# Patient Record
Sex: Female | Born: 1965 | Race: Asian | Hispanic: No | Marital: Married | Smoking: Never smoker
Health system: Southern US, Community
[De-identification: ages and names within clinical notes are randomized; demographics above are authoritative.]

## PROBLEM LIST (undated history)

## (undated) DIAGNOSIS — E785 Hyperlipidemia, unspecified: Secondary | ICD-10-CM

## (undated) DIAGNOSIS — I675 Moyamoya disease: Secondary | ICD-10-CM

## (undated) DIAGNOSIS — E079 Disorder of thyroid, unspecified: Secondary | ICD-10-CM

## (undated) DIAGNOSIS — M1990 Unspecified osteoarthritis, unspecified site: Secondary | ICD-10-CM

## (undated) DIAGNOSIS — T7840XA Allergy, unspecified, initial encounter: Secondary | ICD-10-CM

## (undated) HISTORY — PX: BREAST BIOPSY: SHX20

## (undated) HISTORY — DX: Unspecified osteoarthritis, unspecified site: M19.90

## (undated) HISTORY — DX: Disorder of thyroid, unspecified: E07.9

## (undated) HISTORY — DX: Allergy, unspecified, initial encounter: T78.40XA

## (undated) HISTORY — DX: Moyamoya disease: I67.5

## (undated) HISTORY — PX: KNEE ARTHROSCOPY: SUR90

## (undated) HISTORY — DX: Hyperlipidemia, unspecified: E78.5

---

## 2013-07-01 ENCOUNTER — Ambulatory Visit (INDEPENDENT_AMBULATORY_CARE_PROVIDER_SITE_OTHER): Payer: 59 | Admitting: Women's Health

## 2013-07-01 ENCOUNTER — Encounter: Payer: Self-pay | Admitting: Women's Health

## 2013-07-01 ENCOUNTER — Other Ambulatory Visit (HOSPITAL_COMMUNITY)
Admission: RE | Admit: 2013-07-01 | Discharge: 2013-07-01 | Disposition: A | Discharge status: Home / Self Care | Payer: 59 | Source: Ambulatory Visit | Attending: Women's Health | Admitting: Women's Health

## 2013-07-01 VITALS — BP 102/64 | Ht 61.5 in | Wt 102.4 lb

## 2013-07-01 DIAGNOSIS — Z1151 Encounter for screening for human papillomavirus (HPV): Secondary | ICD-10-CM | POA: Insufficient documentation

## 2013-07-01 DIAGNOSIS — Z13220 Encounter for screening for lipoid disorders: Secondary | ICD-10-CM

## 2013-07-01 DIAGNOSIS — Z01419 Encounter for gynecological examination (general) (routine) without abnormal findings: Secondary | ICD-10-CM | POA: Insufficient documentation

## 2013-07-01 DIAGNOSIS — Z833 Family history of diabetes mellitus: Secondary | ICD-10-CM

## 2013-07-01 DIAGNOSIS — M4120 Other idiopathic scoliosis, site unspecified: Secondary | ICD-10-CM

## 2013-07-01 LAB — CBC WITH DIFFERENTIAL/PLATELET
BASOS PCT: 1 % (ref 0–1)
Basophils Absolute: 0 10*3/uL (ref 0.0–0.1)
EOS ABS: 0.1 10*3/uL (ref 0.0–0.7)
EOS PCT: 3 % (ref 0–5)
HCT: 38.6 % (ref 36.0–46.0)
Hemoglobin: 12.9 g/dL (ref 12.0–15.0)
Lymphocytes Relative: 32 % (ref 12–46)
Lymphs Abs: 1.2 10*3/uL (ref 0.7–4.0)
MCH: 31.6 pg (ref 26.0–34.0)
MCHC: 33.4 g/dL (ref 30.0–36.0)
MCV: 94.6 fL (ref 78.0–100.0)
Monocytes Absolute: 0.3 10*3/uL (ref 0.1–1.0)
Monocytes Relative: 7 % (ref 3–12)
NEUTROS PCT: 57 % (ref 43–77)
Neutro Abs: 2.2 10*3/uL (ref 1.7–7.7)
PLATELETS: 247 10*3/uL (ref 150–400)
RBC: 4.08 MIL/uL (ref 3.87–5.11)
RDW: 13.3 % (ref 11.5–15.5)
WBC: 3.9 10*3/uL — ABNORMAL LOW (ref 4.0–10.5)

## 2013-07-01 LAB — LIPID PANEL
Cholesterol: 190 mg/dL (ref 0–200)
HDL: 69 mg/dL (ref >39)
LDL CALC: 109 mg/dL — AB (ref 0–99)
Total CHOL/HDL Ratio: 2.8 Ratio
Triglycerides: 58 mg/dL (ref <150)
VLDL: 12 mg/dL (ref 0–40)

## 2013-07-01 LAB — GLUCOSE, RANDOM: Glucose, Bld: 70 mg/dL (ref 70–99)

## 2013-07-01 NOTE — Progress Notes (Signed)
Katelyn Velazquez 03/23/1965 [id number]    History:    Presents for annual exam.  Regular monthly cycles/vasectomy. Reports abnormal Paps that were normal with recheck. History of nodular breasts, negative right breast biopsy many years ago, normal mammograms after, questionable new nodular area left outer breast.  Past medical history, past surgical history, family history and social history were all reviewed and documented in the EPIC chart. Katelyn Velazquez. 24 year old daughter doing well has had gardasil. Denies family history of breast cancer, diabetes.  ROS:  A  12 point ROS was performed and pertinent positives and negatives are included.  Exam:  Filed Vitals:   07/01/13 0905  BP: 102/64    General appearance:  Normal Thyroid:  Symmetrical, normal in size, without palpable masses or nodularity. Respiratory  Auscultation:  Clear without wheezing or rhonchi Cardiovascular  Auscultation:  Regular rate, without rubs, murmurs or gallops  Edema/varicosities:  Not grossly evident Abdominal  Soft,nontender, without masses, guarding or rebound.  Liver/spleen:  No organomegaly noted  Hernia:  None appreciated  Skin  Inspection:  Grossly normal   Breasts: Examined lying and sitting.     Right: Fibrous, retractions, discharge or axillary adenopathy.     Left: Fibrous, 1 CM mobile nodule outer quadrant, retractions, discharge or axillary adenopathy. Gentitourinary   Inguinal/mons:  Normal without inguinal adenopathy  External genitalia:  Normal  BUS/Urethra/Skene's glands:  Normal  Vagina:  Normal  Cervix:  Normal  Uterus:   normal in size, shape and contour.  Midline and mobile  Adnexa/parametria:     Rt: Without masses or tenderness.   Lt: Without masses or tenderness.  Anus and perineum: Normal  Digital rectal exam: Normal sphincter tone without palpated masses or tenderness  Assessment/Plan:  48 y.o. MAF G1P1 for annual exam.    History of breast nodules, negative right breast  biopsy many years ago Left breast 1 cm mobile nontender nodule outer quadrant 3:00 Regular monthly cycles/vasectomy  Plan: Review mammogram reports from Katelyn Velazquez, SBE's, report changes. Continue regular exercise/running, calcium rich diet, vitamin D 2000 daily encouraged. CBC, glucose, lipid panel, UA, Pap with HR HPV typing.  Katelyn Velazquez called to review/obtain mammogram reports  Left message concerning questionable left breast outer quadrant 1 CM mobile nodule will get scheduled diagnostic mammogram   Katelyn Velazquez, 10:22 AM 07/01/2013

## 2013-07-01 NOTE — Patient Instructions (Signed)

## 2013-07-02 LAB — URINALYSIS W MICROSCOPIC + REFLEX CULTURE
Bilirubin Urine: NEGATIVE
Casts: NONE SEEN
Crystals: NONE SEEN
GLUCOSE, UA: NEGATIVE mg/dL
HGB URINE DIPSTICK: NEGATIVE
Ketones, ur: NEGATIVE mg/dL
LEUKOCYTES UA: NEGATIVE
NITRITE: NEGATIVE
PROTEIN: NEGATIVE mg/dL
SQUAMOUS EPITHELIAL / LPF: NONE SEEN
Specific Gravity, Urine: <1.005 — ABNORMAL LOW (ref 1.005–1.030)
Urobilinogen, UA: 0.2 mg/dL (ref 0.0–1.0)
pH: 7 (ref 5.0–8.0)

## 2013-07-04 ENCOUNTER — Telehealth: Payer: Self-pay | Admitting: *Deleted

## 2013-07-04 LAB — URINE CULTURE

## 2013-07-04 MED ORDER — SULFAMETHOXAZOLE-TMP DS 800-160 MG PO TABS: 1.0000 | ORAL_TABLET | Freq: Two times a day (BID) | ORAL | Status: DC

## 2013-07-04 NOTE — Telephone Encounter (Signed)
Appointment on 07/07/13 @ 1:00 pm left message for pt to call.

## 2013-07-04 NOTE — Telephone Encounter (Signed)
Pt informed with the below note. 

## 2013-07-04 NOTE — Telephone Encounter (Signed)
Message copied by Thamas Jaegers on Mon Jul 04, 2013  8:36 AM ------      Message from: [place], Candiss Norse      Created: Fri Jul 01, 2013  3:14 PM       Needs diagnostic left breast  mammograms at [facility]. Last mammogram 12/13/2012   Has 1 CM mobile nontender nodule outer quadrant left breast at  3:00. No breast cancer hx in family ------

## 2013-07-08 ENCOUNTER — Encounter: Payer: Self-pay | Admitting: Women's Health

## 2013-08-12 ENCOUNTER — Encounter: Payer: Self-pay | Admitting: Women's Health

## 2013-10-26 ENCOUNTER — Ambulatory Visit (INDEPENDENT_AMBULATORY_CARE_PROVIDER_SITE_OTHER): Payer: 59 | Admitting: Women's Health

## 2013-10-26 ENCOUNTER — Encounter: Payer: Self-pay | Admitting: Women's Health

## 2013-10-26 DIAGNOSIS — N898 Other specified noninflammatory disorders of vagina: Secondary | ICD-10-CM

## 2013-10-26 DIAGNOSIS — B373 Candidiasis of vulva and vagina: Secondary | ICD-10-CM

## 2013-10-26 DIAGNOSIS — B3731 Acute candidiasis of vulva and vagina: Secondary | ICD-10-CM

## 2013-10-26 LAB — WET PREP FOR TRICH, YEAST, CLUE
Clue Cells Wet Prep HPF POC: NONE SEEN
TRICH WET PREP: NONE SEEN

## 2013-10-26 MED ORDER — TERCONAZOLE 0.8 % VA CREA: 1.0000 | TOPICAL_CREAM | Freq: Every day | VAGINAL | Status: DC

## 2013-10-26 NOTE — Patient Instructions (Signed)

## 2013-10-26 NOTE — Progress Notes (Signed)
Patient ID: Katelyn Velazquez, female   DOB: [birth year], 48 y.o.   MRN: [id number] Presents with vaginal  itching, irritation and discomfort with intercourse. Denies urinary symptoms abdominal pain or fever. Monthly cycles/vasectomy.  Exam: Appears well. External genitalia erythematous at introitus, speculum exam moderate amount of a white curdy discharge noted, wet prep positive for yeast. Bimanual no CMT or adnexal fullness or tenderness.  Yeast vaginitis  Plan:  Terazol 3 one applicator at bedtime x3 prescription, proper use given and reviewed. Instructed to call if no relief of symptoms.

## 2013-12-26 ENCOUNTER — Encounter: Payer: Self-pay | Admitting: Women's Health

## 2014-01-23 ENCOUNTER — Encounter: Payer: Self-pay | Admitting: Women's Health

## 2014-07-21 ENCOUNTER — Encounter: Payer: Self-pay | Admitting: Women's Health

## 2014-07-28 ENCOUNTER — Ambulatory Visit (INDEPENDENT_AMBULATORY_CARE_PROVIDER_SITE_OTHER): Payer: 59 | Admitting: Women's Health

## 2014-07-28 ENCOUNTER — Encounter: Payer: Self-pay | Admitting: Women's Health

## 2014-07-28 VITALS — BP 101/62 | Ht 61.5 in | Wt 101.6 lb

## 2014-07-28 DIAGNOSIS — Z01419 Encounter for gynecological examination (general) (routine) without abnormal findings: Secondary | ICD-10-CM | POA: Diagnosis not present

## 2014-07-28 DIAGNOSIS — Z13220 Encounter for screening for lipoid disorders: Secondary | ICD-10-CM | POA: Diagnosis not present

## 2014-07-28 LAB — CBC WITH DIFFERENTIAL/PLATELET
Basophils Absolute: 0 10*3/uL (ref 0.0–0.1)
Basophils Relative: 1 % (ref 0–1)
EOS PCT: 2 % (ref 0–5)
Eosinophils Absolute: 0.1 10*3/uL (ref 0.0–0.7)
HCT: 38.7 % (ref 36.0–46.0)
Hemoglobin: 12.5 g/dL (ref 12.0–15.0)
LYMPHS ABS: 1 10*3/uL (ref 0.7–4.0)
LYMPHS PCT: 23 % (ref 12–46)
MCH: 31.3 pg (ref 26.0–34.0)
MCHC: 32.3 g/dL (ref 30.0–36.0)
MCV: 96.8 fL (ref 78.0–100.0)
MPV: 9.6 fL (ref 8.6–12.4)
Monocytes Absolute: 0.4 10*3/uL (ref 0.1–1.0)
Monocytes Relative: 9 % (ref 3–12)
NEUTROS ABS: 2.8 10*3/uL (ref 1.7–7.7)
Neutrophils Relative %: 65 % (ref 43–77)
Platelets: 262 10*3/uL (ref 150–400)
RBC: 4 MIL/uL (ref 3.87–5.11)
RDW: 13.4 % (ref 11.5–15.5)
WBC: 4.3 10*3/uL (ref 4.0–10.5)

## 2014-07-28 LAB — COMPREHENSIVE METABOLIC PANEL
ALBUMIN: 4.3 g/dL (ref 3.5–5.2)
ALK PHOS: 57 U/L (ref 39–117)
ALT: 31 U/L (ref 0–35)
AST: 32 U/L (ref 0–37)
BILIRUBIN TOTAL: 0.5 mg/dL (ref 0.2–1.2)
BUN: 11 mg/dL (ref 6–23)
CALCIUM: 8.9 mg/dL (ref 8.4–10.5)
CO2: 23 meq/L (ref 19–32)
Chloride: 104 mEq/L (ref 96–112)
Creat: 0.85 mg/dL (ref 0.50–1.10)
Glucose, Bld: 94 mg/dL (ref 70–99)
Potassium: 4.7 mEq/L (ref 3.5–5.3)
SODIUM: 136 meq/L (ref 135–145)
Total Protein: 7 g/dL (ref 6.0–8.3)

## 2014-07-28 LAB — LIPID PANEL
Cholesterol: 191 mg/dL (ref 0–200)
HDL: 74 mg/dL (ref >=46)
LDL CALC: 104 mg/dL — AB (ref 0–99)
TRIGLYCERIDES: 63 mg/dL (ref <150)
Total CHOL/HDL Ratio: 2.6 Ratio
VLDL: 13 mg/dL (ref 0–40)

## 2014-07-28 NOTE — Progress Notes (Signed)
Katelyn Velazquez 16-Mar-1965 [id number]    History:    Presents for annual exam.  Monthly cycles/vasectomy. Left breast nodule benign cyst noted on ultrasound 07/2013 annual mammogram due. History of a breast nodule, biopsy negative many years ago. Normal Pap history.  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 Mother Parkinson, father prostate cancer.  ROS:  A ROS was performed and pertinent positives and negatives are included.  Exam:  Filed Vitals:   07/28/14 0806  BP: 101/62    General appearance:  Normal Thyroid:  Symmetrical, normal in size, without palpable masses or nodularity. Respiratory  Auscultation:  Clear without wheezing or rhonchi Cardiovascular  Auscultation:  Regular rate, without rubs, murmurs or gallops  Edema/varicosities:  Not grossly evident Abdominal  Soft,nontender, without masses, guarding or rebound.  Liver/spleen:  No organomegaly noted  Hernia:  None appreciated  Skin  Inspection:  Grossly normal   Breasts: Examined lying and sitting.     Right: Without masses, retractions, discharge or axillary adenopathy.     Left: Without masses, retractions, discharge or axillary adenopathy. Gentitourinary   Inguinal/mons:  Normal without inguinal adenopathy  External genitalia:  Normal  BUS/Urethra/Skene's glands:  Normal  Vagina:  Normal  Cervix:  Normal  Uterus:  normal in size, shape and contour.  Midline and mobile  Adnexa/parametria:     Rt: Without masses or tenderness.   Lt: Without masses or tenderness.  Anus and perineum: Normal  Digital rectal exam: Normal sphincter tone without palpated masses or tenderness  Assessment/Plan:  49 y.o. MAF G1P1 for annual exam with no complaints.  Monthly cycle/vasectomy Left breast cyst  Plan: Screening colonoscopy reviewed and encouraged, [facility] information  given. SBE's, schedule annual 3-D mammogram history of dense breasts. Continue healthy lifestyle of diet and exercise, vitamin D 1000 daily encouraged. CBC, C met, lipid panel, UA, Pap normal with negative HR HPV 2015 screening guidelines reviewed.    [place], 8:36 AM 07/28/2014

## 2014-07-28 NOTE — Patient Instructions (Signed)

## 2014-09-19 ENCOUNTER — Telehealth: Payer: Self-pay | Admitting: *Deleted

## 2014-09-19 NOTE — Telephone Encounter (Signed)
Pt informed with normal labs on 07/28/14 office visit.

## 2014-10-12 ENCOUNTER — Encounter: Payer: Self-pay | Admitting: Women's Health

## 2014-10-12 ENCOUNTER — Ambulatory Visit (INDEPENDENT_AMBULATORY_CARE_PROVIDER_SITE_OTHER): Payer: 59 | Admitting: Women's Health

## 2014-10-12 DIAGNOSIS — B3731 Acute candidiasis of vulva and vagina: Secondary | ICD-10-CM

## 2014-10-12 DIAGNOSIS — N898 Other specified noninflammatory disorders of vagina: Secondary | ICD-10-CM

## 2014-10-12 DIAGNOSIS — B373 Candidiasis of vulva and vagina: Secondary | ICD-10-CM | POA: Diagnosis not present

## 2014-10-12 LAB — WET PREP FOR TRICH, YEAST, CLUE
CLUE CELLS WET PREP: NONE SEEN
TRICH WET PREP: NONE SEEN
YEAST WET PREP: NONE SEEN

## 2014-10-12 MED ORDER — TERCONAZOLE 0.8 % VA CREA: 1.0000 | TOPICAL_CREAM | Freq: Every day | VAGINAL | Status: DC

## 2014-10-12 NOTE — Progress Notes (Signed)
Patient ID: Katelyn Velazquez, female   DOB: [birth year], 49 y.o.   MRN: [id number] Presents with complaint of increased discharge, vaginal irritation with itching for past 2 weeks. Discomfort with intercourse.  Denies odor, urinary symptoms, abdominal pain or fever. Monthly cycles/vasectomy.  Exam: Appears well. External genitalia erythematous at introitus, speculum exam vaginal walls erythematous no discharge noted wet prep negative. Bimanual no CMT or tenderness, tenderness mostly in vaginal area.  Vaginal irritation probable yeast  Plan: Reviewed could be yeast and some vaginal dryness associated with aging. Will try Diflucan Terazol 3 one applicator at bedtime and apply external. Call if no relief, vaginal lubricants with intercourse.

## 2014-10-31 ENCOUNTER — Telehealth: Payer: Self-pay | Admitting: *Deleted

## 2014-10-31 NOTE — Telephone Encounter (Signed)
Pt called c/o vaginal dryness requesting Rx for Diflucan states not itching mainly irritation. Pt said intercourse is unbearable due to vaginal dryness, I recommend pt try OTC Replens to help with this. Pt will follow up if needed.

## 2015-01-10 ENCOUNTER — Encounter: Payer: Self-pay | Admitting: Gastroenterology

## 2015-01-29 ENCOUNTER — Ambulatory Visit (AMBULATORY_SURGERY_CENTER): Payer: Self-pay | Admitting: *Deleted

## 2015-01-29 VITALS — Ht 61.75 in | Wt 97.4 lb

## 2015-01-29 DIAGNOSIS — Z1211 Encounter for screening for malignant neoplasm of colon: Secondary | ICD-10-CM

## 2015-01-29 MED ORDER — NA SULFATE-K SULFATE-MG SULF 17.5-3.13-1.6 GM/177ML PO SOLN: ORAL | Status: DC

## 2015-01-29 NOTE — Progress Notes (Signed)
Patient denies any allergies to eggs or soy. Patient denies any problems with anesthesia/sedation. Patient denies any oxygen use at home and does not take any diet/weight loss medications. Patient declined EMMI information today.  

## 2015-01-30 ENCOUNTER — Encounter: Payer: Self-pay | Admitting: Gastroenterology

## 2015-01-31 ENCOUNTER — Encounter: Payer: Self-pay | Admitting: Women's Health

## 2015-02-12 ENCOUNTER — Encounter: Payer: Self-pay | Admitting: Gastroenterology

## 2015-02-12 ENCOUNTER — Ambulatory Visit (AMBULATORY_SURGERY_CENTER): Payer: 59 | Admitting: Gastroenterology

## 2015-02-12 VITALS — BP 109/73 | HR 59 | Temp 96.7°F | Resp 16 | Ht 61.75 in | Wt 97.0 lb

## 2015-02-12 DIAGNOSIS — Z1211 Encounter for screening for malignant neoplasm of colon: Secondary | ICD-10-CM

## 2015-02-12 DIAGNOSIS — K621 Rectal polyp: Secondary | ICD-10-CM

## 2015-02-12 DIAGNOSIS — D128 Benign neoplasm of rectum: Secondary | ICD-10-CM

## 2015-02-12 MED ORDER — SODIUM CHLORIDE 0.9 % IV SOLN: 500.0000 mL | INTRAVENOUS | Status: DC

## 2015-02-12 NOTE — Op Note (Signed)
[facility]  [address] [place], [id number]   COLONOSCOPY PROCEDURE REPORT  PATIENT: Katelyn Velazquez, Katelyn Velazquez  MR#: [id number] BIRTHDATE: 06/21/1965 , 49  yrs. old GENDER: female ENDOSCOPIST: Milus Banister, MD REFERRED [id number] Spear, M.D. PROCEDURE DATE:  02/12/2015 PROCEDURE:   Colonoscopy, screening and Colonoscopy with snare polypectomy First Screening Colonoscopy - Avg.  risk and is 50 yrs.  old or older - No.  Prior Negative Screening - Now for repeat screening. N/A  History of Adenoma - Now for follow-up colonoscopy & has been > or = to 3 yrs.  N/A  Polyps removed today? Yes ASA CLASS:   Class II INDICATIONS:Screening for colonic neoplasia and Colorectal Neoplasm Risk Assessment for this procedure is average risk. MEDICATIONS: Monitored anesthesia care and Propofol 180 mg IV  DESCRIPTION OF PROCEDURE:   After the risks benefits and alternatives of the procedure were thoroughly explained, informed consent was obtained.  The digital rectal exam revealed no abnormalities of the rectum.   The LB [id number]  endoscope was introduced through the anus and advanced to the cecum, which was identified by both the appendix and ileocecal valve. No adverse events experienced.   The quality of the prep was excellent.  The instrument was then slowly withdrawn as the colon was fully examined. Estimated blood loss is zero unless otherwise noted in this procedure report.   COLON FINDINGS: A sessile polyp measuring 3 mm in size was found in the rectum.  A polypectomy was performed with a cold snare.  The resection was complete, the polyp tissue was completely retrieved and sent to histology.   The examination was otherwise normal. Retroflexed views revealed no abnormalities. The time to cecum = 4.4 Withdrawal time = 13.4   The scope was withdrawn and the procedure completed. COMPLICATIONS: There were no immediate complications.  ENDOSCOPIC IMPRESSION: 1.   Sessile  polyp was found in the rectum; polypectomy was performed with a cold snare 2.   The examination was otherwise normal  RECOMMENDATIONS: If the polyp(s) removed today are proven to be adenomatous (pre-cancerous) polyps, you will need a repeat colonoscopy in 5 years.  Otherwise you should continue to follow colorectal cancer screening guidelines for "routine risk" patients with colonoscopy in 10 years.  You will receive a letter within 1-2 weeks with the results of your biopsy as well as final recommendations.  Please call my office if you have not received a letter after 3 weeks.  eSigned:  Milus Banister, MD 02/12/2015 9:30 AM

## 2015-02-12 NOTE — Progress Notes (Signed)
Called to room to assist during endoscopic procedure.  Patient ID and intended procedure confirmed with present staff. Received instructions for my participation in the procedure from the performing physician.  

## 2015-02-12 NOTE — Progress Notes (Signed)
Pt reported she drank 3 sips of water of 07:40 on her way to the [place] this am.  April Mirtz, RN reported this info to Osvaldo Angst, CRNA and Dr. Owens Loffler, MD.  They both agreed ok to proceed with colonoscopy without extra wait. Maw    April Mirtz, RN advised pt there will be not extra wait for her exam. maw

## 2015-02-12 NOTE — Patient Instructions (Signed)
YOU HAD AN ENDOSCOPIC PROCEDURE TODAY AT THE [facility]:   Refer to the procedure report that was given to you for any specific questions about what was found during the examination.  If the procedure report does not answer your questions, please call your gastroenterologist to clarify.  If you requested that your care partner not be given the details of your procedure findings, then the procedure report has been included in a sealed envelope for you to review at your convenience later.  YOU SHOULD EXPECT: Some feelings of bloating in the abdomen. Passage of more gas than usual.  Walking can help get rid of the air that was put into your GI tract during the procedure and reduce the bloating. If you had a lower endoscopy (such as a colonoscopy or flexible sigmoidoscopy) you may notice spotting of blood in your stool or on the toilet paper. If you underwent a bowel prep for your procedure, you may not have a normal bowel movement for a few days.  Please Note:  You might notice some irritation and congestion in your nose or some drainage.  This is from the oxygen used during your procedure.  There is no need for concern and it should clear up in a day or so.  SYMPTOMS TO REPORT IMMEDIATELY:   Following lower endoscopy (colonoscopy or flexible sigmoidoscopy):  Excessive amounts of blood in the stool  Significant tenderness or worsening of abdominal pains  Swelling of the abdomen that is new, acute  Fever of 100F or higher   For urgent or emergent issues, a gastroenterologist can be reached at any hour by calling [phone].   DIET: Your first meal following the procedure should be a small meal and then it is ok to progress to your normal diet. Heavy or fried foods are harder to digest and may make you feel nauseous or bloated.  Likewise, meals heavy in dairy and vegetables can increase bloating.  Drink plenty of fluids but you should avoid alcoholic beverages for 24  hours.  ACTIVITY:  You should plan to take it easy for the rest of today and you should NOT DRIVE or use heavy machinery until tomorrow (because of the sedation medicines used during the test).    FOLLOW UP: Our staff will call the number listed on your records the next business day following your procedure to check on you and address any questions or concerns that you may have regarding the information given to you following your procedure. If we do not reach you, we will leave a message.  However, if you are feeling well and you are not experiencing any problems, there is no need to return our call.  We will assume that you have returned to your regular daily activities without incident.  If any biopsies were taken you will be contacted by phone or by letter within the next 1-3 weeks.  Please call us at [phone] if you have not heard about the biopsies in 3 weeks.    SIGNATURES/CONFIDENTIALITY: You and/or your care partner have signed paperwork which will be entered into your electronic medical record.  These signatures attest to the fact that that the information above on your After Visit Summary has been reviewed and is understood.  Full responsibility of the confidentiality of this discharge information lies with you and/or your care-partner. 

## 2015-02-13 ENCOUNTER — Telehealth: Payer: Self-pay | Admitting: Emergency Medicine

## 2015-02-13 NOTE — Telephone Encounter (Signed)
   Follow up Call-  Call back number 02/12/2015  Post procedure Call Back phone  # [phone] hm  Permission to leave phone message Yes     Patient questions:  Do you have a fever, pain , or abdominal swelling? No. Pain Score  0 *  Have you tolerated food without any problems? Yes.    Have you been able to return to your normal activities? Yes.    Do you have any questions about your discharge instructions: Diet   No. Medications  No. Follow up visit  No.  Do you have questions or concerns about your Care? No.  Actions: * If pain score is 4 or above: No action needed, pain <4.

## 2015-02-16 ENCOUNTER — Encounter: Payer: Self-pay | Admitting: Gastroenterology

## 2015-09-04 ENCOUNTER — Encounter: Payer: 59 | Admitting: Women's Health

## 2015-09-27 ENCOUNTER — Ambulatory Visit (INDEPENDENT_AMBULATORY_CARE_PROVIDER_SITE_OTHER): Payer: Managed Care, Other (non HMO) | Admitting: Women's Health

## 2015-09-27 ENCOUNTER — Encounter: Payer: Self-pay | Admitting: Women's Health

## 2015-09-27 VITALS — BP 120/78 | Ht 61.0 in | Wt 101.0 lb

## 2015-09-27 DIAGNOSIS — Z1329 Encounter for screening for other suspected endocrine disorder: Secondary | ICD-10-CM

## 2015-09-27 DIAGNOSIS — Z01419 Encounter for gynecological examination (general) (routine) without abnormal findings: Secondary | ICD-10-CM

## 2015-09-27 NOTE — Progress Notes (Signed)
Katelyn Velazquez February 15, 1966 [id number]    History:    Presents for annual exam.  Monthly cycles/vasectomy. Denies menopausal symptoms. Normal Pap and mammogram history. History of a benign breast nodule. 01/2015 benign colon polyp repeat in 10 years.  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 Mother died from Parkinson's, father prostate cancer.  ROS:  A ROS was performed and pertinent positives and negatives are included.  Exam:  Vitals:   09/27/15 1526  BP: 120/78  Weight: 101 lb (45.8 kg)  Height: _0  (1.549 m)   Body mass index is 19.08 kg/m.   General appearance:  Scoliosis Thyroid:  Symmetrical, normal in size, without palpable masses or nodularity. Respiratory  Auscultation:  Clear without wheezing or rhonchi Cardiovascular  Auscultation:  Regular rate, without rubs, murmurs or gallops  Edema/varicosities:  Not grossly evident Abdominal  Soft,nontender, without masses, guarding or rebound.  Liver/spleen:  No organomegaly noted  Hernia:  None appreciated  Skin  Inspection:  Grossly normal   Breasts: Examined lying and sitting.     Right: Without masses, retractions, discharge or axillary adenopathy.     Left: Without masses, retractions, discharge or axillary adenopathy. Gentitourinary   Inguinal/mons:  Normal without inguinal adenopathy  External genitalia:  Normal  BUS/Urethra/Skene's glands:  Normal  Vagina:  Normal  Cervix:  Normal  Uterus:  normal in size, shape and contour.  Midline and mobile  Adnexa/parametria:     Rt: Without masses or tenderness.   Lt: Without masses or tenderness.  Anus and perineum: Normal  Digital rectal exam: Normal sphincter tone without palpated masses or tenderness  Assessment/Plan:  50 y.o. MAF G1 P1  for annual exam with complaint of left knee pain.  Monthly cycle/vasectomy Left knee  pain-follow-up with orthopedist  Plan: SBE's, exercise, calcium rich diet, vitamin D 1000 daily encouraged. Continue healthy lifestyle, follow-up with orthopedist, knee injury from running. CBC, CMP, TSH, vitamin D, UA, Pap normal with negative HR HPV 2015, new screening guidelines reviewed. Normal lipid panel 2016.  Huel Cote [facility], 4:01 PM 09/27/2015

## 2015-09-27 NOTE — Patient Instructions (Signed)
Health Maintenance, Female Adopting a healthy lifestyle and getting preventive care can go a long way to promote health and wellness. Talk with your health care provider about what schedule of regular examinations is right for you. This is a good chance for you to check in with your provider about disease prevention and staying healthy. In between checkups, there are plenty of things you can do on your own. Experts have done a lot of research about which lifestyle changes and preventive measures are most likely to keep you healthy. Ask your health care provider for more information. WEIGHT AND DIET  Eat a healthy diet  Be sure to include plenty of vegetables, fruits, low-fat dairy products, and lean protein.  Do not eat a lot of foods high in solid fats, added sugars, or salt.  Get regular exercise. This is one of the most important things you can do for your health.  Most adults should exercise for at least 150 minutes each week. The exercise should increase your heart rate and make you sweat (moderate-intensity exercise).  Most adults should also do strengthening exercises at least twice a week. This is in addition to the moderate-intensity exercise.  Maintain a healthy weight  Body mass index (BMI) is a measurement that can be used to identify possible weight problems. It estimates body fat based on height and weight. Your health care provider can help determine your BMI and help you achieve or maintain a healthy weight.  For females 20 years of age and older:   A BMI below 18.5 is considered underweight.  A BMI of 18.5 to 24.9 is normal.  A BMI of 25 to 29.9 is considered overweight.  A BMI of 30 and above is considered obese.  Watch levels of cholesterol and blood lipids  You should start having your blood tested for lipids and cholesterol at 52 years of age, then have this test every 5 years.  You may need to have your cholesterol levels checked more often if:  Your lipid  or cholesterol levels are high.  You are older than 52 years of age.  You are at high risk for heart disease.  CANCER SCREENING   Lung Cancer  Lung cancer screening is recommended for adults 55-80 years old who are at high risk for lung cancer because of a history of smoking.  A yearly low-dose CT scan of the lungs is recommended for people who:  Currently smoke.  Have quit within the past 15 years.  Have at least a 30-pack-year history of smoking. A pack year is smoking an average of one pack of cigarettes a day for 1 year.  Yearly screening should continue until it has been 15 years since you quit.  Yearly screening should stop if you develop a health problem that would prevent you from having lung cancer treatment.  Breast Cancer  Practice breast self-awareness. This means understanding how your breasts normally appear and feel.  It also means doing regular breast self-exams. Let your health care provider know about any changes, no matter how small.  If you are in your 20s or 30s, you should have a clinical breast exam (CBE) by a health care provider every 1-3 years as part of a regular health exam.  If you are 40 or older, have a CBE every year. Also consider having a breast X-ray (mammogram) every year.  If you have a family history of breast cancer, talk to your health care provider about genetic screening.  If you   are at high risk for breast cancer, talk to your health care provider about having an MRI and a mammogram every year.  Breast cancer gene (BRCA) assessment is recommended for women who have family members with BRCA-related cancers. BRCA-related cancers include:  Breast.  Ovarian.  Tubal.  Peritoneal cancers.  Results of the assessment will determine the need for genetic counseling and BRCA1 and BRCA2 testing. Cervical Cancer Your health care provider may recommend that you be screened regularly for cancer of the pelvic organs (ovaries, uterus, and  vagina). This screening involves a pelvic examination, including checking for microscopic changes to the surface of your cervix (Pap test). You may be encouraged to have this screening done every 3 years, beginning at age 21.  For women ages 30-65, health care providers may recommend pelvic exams and Pap testing every 3 years, or they may recommend the Pap and pelvic exam, combined with testing for human papilloma virus (HPV), every 5 years. Some types of HPV increase your risk of cervical cancer. Testing for HPV may also be done on women of any age with unclear Pap test results.  Other health care providers may not recommend any screening for nonpregnant women who are considered low risk for pelvic cancer and who do not have symptoms. Ask your health care provider if a screening pelvic exam is right for you.  If you have had past treatment for cervical cancer or a condition that could lead to cancer, you need Pap tests and screening for cancer for at least 20 years after your treatment. If Pap tests have been discontinued, your risk factors (such as having a new sexual partner) need to be reassessed to determine if screening should resume. Some women have medical problems that increase the chance of getting cervical cancer. In these cases, your health care provider may recommend more frequent screening and Pap tests. Colorectal Cancer  This type of cancer can be detected and often prevented.  Routine colorectal cancer screening usually begins at 52 years of age and continues through 52 years of age.  Your health care provider may recommend screening at an earlier age if you have risk factors for colon cancer.  Your health care provider may also recommend using home test kits to check for hidden blood in the stool.  A small camera at the end of a tube can be used to examine your colon directly (sigmoidoscopy or colonoscopy). This is done to check for the earliest forms of colorectal  cancer.  Routine screening usually begins at age 50.  Direct examination of the colon should be repeated every 5-10 years through 52 years of age. However, you may need to be screened more often if early forms of precancerous polyps or small growths are found. Skin Cancer  Check your skin from head to toe regularly.  Tell your health care provider about any new moles or changes in moles, especially if there is a change in a mole's shape or color.  Also tell your health care provider if you have a mole that is larger than the size of a pencil eraser.  Always use sunscreen. Apply sunscreen liberally and repeatedly throughout the day.  Protect yourself by wearing long sleeves, pants, a wide-brimmed hat, and sunglasses whenever you are outside. HEART DISEASE, DIABETES, AND HIGH BLOOD PRESSURE   High blood pressure causes heart disease and increases the risk of stroke. High blood pressure is more likely to develop in:  People who have blood pressure in the high end   of the normal range (130-139/85-89 mm Hg).  People who are overweight or obese.  People who are African American.  If you are 38-23 years of age, have your blood pressure checked every 3-5 years. If you are 61 years of age or older, have your blood pressure checked every year. You should have your blood pressure measured twice--once when you are at a hospital or clinic, and once when you are not at a hospital or clinic. Record the average of the two measurements. To check your blood pressure when you are not at a hospital or clinic, you can use:  An automated blood pressure machine at a pharmacy.  A home blood pressure monitor.  If you are between 45 years and 39 years old, ask your health care provider if you should take aspirin to prevent strokes.  Have regular diabetes screenings. This involves taking a blood sample to check your fasting blood sugar level.  If you are at a normal weight and have a low risk for diabetes,  have this test once every three years after 52 years of age.  If you are overweight and have a high risk for diabetes, consider being tested at a younger age or more often. PREVENTING INFECTION  Hepatitis B  If you have a higher risk for hepatitis B, you should be screened for this virus. You are considered at high risk for hepatitis B if:  You were born in a country where hepatitis B is common. Ask your health care provider which countries are considered high risk.  Your parents were born in a high-risk country, and you have not been immunized against hepatitis B (hepatitis B vaccine).  You have HIV or AIDS.  You use needles to inject street drugs.  You live with someone who has hepatitis B.  You have had sex with someone who has hepatitis B.  You get hemodialysis treatment.  You take certain medicines for conditions, including cancer, organ transplantation, and autoimmune conditions. Hepatitis C  Blood testing is recommended for:  Everyone born from [birth year] through 1967.  Anyone with known risk factors for hepatitis C. Sexually transmitted infections (STIs)  You should be screened for sexually transmitted infections (STIs) including gonorrhea and chlamydia if:  You are sexually active and are younger than 52 years of age.  You are older than 53 years of age and your health care provider tells you that you are at risk for this type of infection.  Your sexual activity has changed since you were last screened and you are at an increased risk for chlamydia or gonorrhea. Ask your health care provider if you are at risk.  If you do not have HIV, but are at risk, it may be recommended that you take a prescription medicine daily to prevent HIV infection. This is called pre-exposure prophylaxis (PrEP). You are considered at risk if:  You are sexually active and do not regularly use condoms or know the HIV status of your partner(s).  You take drugs by injection.  You are sexually  active with a partner who has HIV. Talk with your health care provider about whether you are at high risk of being infected with HIV. If you choose to begin PrEP, you should first be tested for HIV. You should then be tested every 3 months for as long as you are taking PrEP.  PREGNANCY   If you are premenopausal and you may become pregnant, ask your health care provider about preconception counseling.  If you may  become pregnant, take 400 to 800 micrograms (mcg) of folic acid every day.  If you want to prevent pregnancy, talk to your health care provider about birth control (contraception). OSTEOPOROSIS AND MENOPAUSE   Osteoporosis is a disease in which the bones lose minerals and strength with aging. This can result in serious bone fractures. Your risk for osteoporosis can be identified using a bone density scan.  If you are 61 years of age or older, or if you are at risk for osteoporosis and fractures, ask your health care provider if you should be screened.  Ask your health care provider whether you should take a calcium or vitamin D supplement to lower your risk for osteoporosis.  Menopause may have certain physical symptoms and risks.  Hormone replacement therapy may reduce some of these symptoms and risks. Talk to your health care provider about whether hormone replacement therapy is right for you.  HOME CARE INSTRUCTIONS   Schedule regular health, dental, and eye exams.  Stay current with your immunizations.   Do not use any tobacco products including cigarettes, chewing tobacco, or electronic cigarettes.  If you are pregnant, do not drink alcohol.  If you are breastfeeding, limit how much and how often you drink alcohol.  Limit alcohol intake to no more than 1 drink per day for nonpregnant women. One drink equals 12 ounces of beer, 5 ounces of wine, or 1 ounces of hard liquor.  Do not use street drugs.  Do not share needles.  Ask your health care provider for help if  you need support or information about quitting drugs.  Tell your health care provider if you often feel depressed.  Tell your health care provider if you have ever been abused or do not feel safe at home.   This information is not intended to replace advice given to you by your health care provider. Make sure you discuss any questions you have with your health care provider.   Document Released: 08/26/2010 Document Revised: 03/03/2014 Document Reviewed: 01/12/2013 Elsevier Interactive Patient Education [employer].

## 2015-09-28 LAB — CBC WITH DIFFERENTIAL/PLATELET
BASOS PCT: 1 %
Basophils Absolute: 42 cells/uL (ref 0–200)
EOS ABS: 126 {cells}/uL (ref 15–500)
Eosinophils Relative: 3 %
HEMATOCRIT: 32.8 % — AB (ref 35.0–45.0)
HEMOGLOBIN: 10.3 g/dL — AB (ref 11.7–15.5)
LYMPHS ABS: 1764 {cells}/uL (ref 850–3900)
LYMPHS PCT: 42 %
MCH: 28 pg (ref 27.0–33.0)
MCHC: 31.4 g/dL — ABNORMAL LOW (ref 32.0–36.0)
MCV: 89.1 fL (ref 80.0–100.0)
MONO ABS: 336 {cells}/uL (ref 200–950)
MPV: 9 fL (ref 7.5–12.5)
Monocytes Relative: 8 %
NEUTROS ABS: 1932 {cells}/uL (ref 1500–7800)
Neutrophils Relative %: 46 %
Platelets: 311 10*3/uL (ref 140–400)
RBC: 3.68 MIL/uL — AB (ref 3.80–5.10)
RDW: 14.8 % (ref 11.0–15.0)
WBC: 4.2 10*3/uL (ref 3.8–10.8)

## 2015-09-28 LAB — TSH: TSH: 2.17 m[IU]/L

## 2015-09-29 LAB — COMPREHENSIVE METABOLIC PANEL
ALK PHOS: 53 U/L (ref 33–130)
ALT: 12 U/L (ref 6–29)
AST: 18 U/L (ref 10–35)
Albumin: 4.1 g/dL (ref 3.6–5.1)
BILIRUBIN TOTAL: 0.4 mg/dL (ref 0.2–1.2)
BUN: 15 mg/dL (ref 7–25)
CALCIUM: 8.8 mg/dL (ref 8.6–10.4)
CO2: 24 mmol/L (ref 20–31)
Chloride: 103 mmol/L (ref 98–110)
Creat: 1.04 mg/dL (ref 0.50–1.05)
GLUCOSE: 90 mg/dL (ref 65–99)
POTASSIUM: 4.2 mmol/L (ref 3.5–5.3)
Sodium: 136 mmol/L (ref 135–146)
TOTAL PROTEIN: 6.9 g/dL (ref 6.1–8.1)

## 2015-09-29 LAB — VITAMIN D 25 HYDROXY (VIT D DEFICIENCY, FRACTURES): Vit D, 25-Hydroxy: 28 ng/mL — ABNORMAL LOW (ref 30–100)

## 2015-10-24 ENCOUNTER — Other Ambulatory Visit: Payer: Self-pay | Admitting: Women's Health

## 2015-10-24 DIAGNOSIS — E559 Vitamin D deficiency, unspecified: Secondary | ICD-10-CM

## 2015-10-24 DIAGNOSIS — R710 Precipitous drop in hematocrit: Secondary | ICD-10-CM

## 2015-10-24 MED ORDER — VITAMIN D (ERGOCALCIFEROL) 1.25 MG (50000 UNIT) PO CAPS: 50000.0000 [IU] | ORAL_CAPSULE | ORAL | 0 refills | Status: DC

## 2016-01-08 ENCOUNTER — Encounter: Payer: Self-pay | Admitting: Women's Health

## 2016-01-08 ENCOUNTER — Telehealth: Payer: Self-pay | Admitting: *Deleted

## 2016-01-08 DIAGNOSIS — I675 Moyamoya disease: Secondary | ICD-10-CM

## 2016-01-08 NOTE — Telephone Encounter (Signed)
Referral placed, [facility] will contact pt to schedule.

## 2016-01-08 NOTE — Telephone Encounter (Signed)
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  See her note thanks.

## 2016-01-10 ENCOUNTER — Telehealth: Payer: Self-pay | Admitting: *Deleted

## 2016-01-10 DIAGNOSIS — Z1159 Encounter for screening for other viral diseases: Secondary | ICD-10-CM

## 2016-01-10 NOTE — Telephone Encounter (Signed)
Per 10/24/15 note pt will need to have repeat CBC, Vitamin d, these orders are placed already. I will place order for hep. C.

## 2016-01-10 NOTE — Telephone Encounter (Signed)
Appointment 01/30/16 with Dr.Willis

## 2016-01-10 NOTE — Telephone Encounter (Signed)
Pt coming on 01/24/16 for hep. C and T-Dap asked if Vitamin d level could be checked as well? Please advise

## 2016-01-10 NOTE — Telephone Encounter (Signed)
Yes that would be fine please added to the labs

## 2016-01-21 ENCOUNTER — Other Ambulatory Visit: Payer: Self-pay | Admitting: Women's Health

## 2016-01-21 ENCOUNTER — Encounter: Payer: Self-pay | Admitting: Women's Health

## 2016-01-21 DIAGNOSIS — M1990 Unspecified osteoarthritis, unspecified site: Secondary | ICD-10-CM

## 2016-01-22 ENCOUNTER — Other Ambulatory Visit: Payer: Self-pay | Admitting: Women's Health

## 2016-01-22 ENCOUNTER — Other Ambulatory Visit: Payer: Managed Care, Other (non HMO)

## 2016-01-22 DIAGNOSIS — M1990 Unspecified osteoarthritis, unspecified site: Secondary | ICD-10-CM

## 2016-01-22 DIAGNOSIS — E559 Vitamin D deficiency, unspecified: Secondary | ICD-10-CM

## 2016-01-22 DIAGNOSIS — Z1159 Encounter for screening for other viral diseases: Secondary | ICD-10-CM

## 2016-01-22 DIAGNOSIS — R710 Precipitous drop in hematocrit: Secondary | ICD-10-CM

## 2016-01-22 LAB — CBC WITH DIFFERENTIAL/PLATELET
BASOS ABS: 28 {cells}/uL (ref 0–200)
Basophils Relative: 1 %
EOS PCT: 1 %
Eosinophils Absolute: 28 cells/uL (ref 15–500)
HCT: 36.6 % (ref 35.0–45.0)
HEMOGLOBIN: 11.6 g/dL — AB (ref 11.7–15.5)
LYMPHS ABS: 1176 {cells}/uL (ref 850–3900)
LYMPHS PCT: 42 %
MCH: 28 pg (ref 27.0–33.0)
MCHC: 31.7 g/dL — AB (ref 32.0–36.0)
MCV: 88.4 fL (ref 80.0–100.0)
MPV: 9 fL (ref 7.5–12.5)
Monocytes Absolute: 224 cells/uL (ref 200–950)
Monocytes Relative: 8 %
NEUTROS PCT: 48 %
Neutro Abs: 1344 cells/uL — ABNORMAL LOW (ref 1500–7800)
Platelets: 283 10*3/uL (ref 140–400)
RBC: 4.14 MIL/uL (ref 3.80–5.10)
RDW: 16.4 % — AB (ref 11.0–15.0)
WBC: 2.8 10*3/uL — AB (ref 3.8–10.8)

## 2016-01-23 LAB — URIC ACID: URIC ACID, SERUM: 5.2 mg/dL (ref 2.5–7.0)

## 2016-01-23 LAB — HEPATITIS C ANTIBODY: HCV AB: NEGATIVE

## 2016-01-23 LAB — SEDIMENTATION RATE: Sed Rate: 6 mm/hr (ref 0–30)

## 2016-01-23 LAB — RHEUMATOID FACTOR

## 2016-01-23 LAB — VITAMIN D 25 HYDROXY (VIT D DEFICIENCY, FRACTURES): VIT D 25 HYDROXY: 59 ng/mL (ref 30–100)

## 2016-01-24 ENCOUNTER — Other Ambulatory Visit: Payer: Managed Care, Other (non HMO)

## 2016-01-24 LAB — ANA: Anti Nuclear Antibody(ANA): NEGATIVE

## 2016-01-25 ENCOUNTER — Encounter: Payer: Self-pay | Admitting: Women's Health

## 2016-01-25 ENCOUNTER — Telehealth: Payer: Self-pay | Admitting: *Deleted

## 2016-01-25 NOTE — Telephone Encounter (Signed)
-----   Message from Huel Cote, NP sent at 01/23/2016  2:01 PM EST ----- Please schedule her an appointment with hand specialist, preferably  at [facility] orthopedic she has been seen in the past for new problem. Left hand,  middle finger for the past several weeks increasingly tender, swollen difficulty straightening out the finger. No known injury. She has not had a fever. She is right-handed. Normal sedimentation rate, negative rheumatoid factor, WBC low at 2.8 Thanks

## 2016-01-25 NOTE — Telephone Encounter (Signed)
Appointment on 01/29/16 @ 2:45pm with Dr.Rogers at [facility] orthopedic, notes faxed. Pt aware.

## 2016-01-30 ENCOUNTER — Encounter: Payer: Self-pay | Admitting: Neurology

## 2016-01-30 ENCOUNTER — Ambulatory Visit (INDEPENDENT_AMBULATORY_CARE_PROVIDER_SITE_OTHER): Payer: Managed Care, Other (non HMO) | Admitting: Neurology

## 2016-01-30 VITALS — BP 128/76 | HR 64 | Ht 61.0 in | Wt 103.0 lb

## 2016-01-30 DIAGNOSIS — I675 Moyamoya disease: Secondary | ICD-10-CM | POA: Diagnosis not present

## 2016-01-30 NOTE — Progress Notes (Signed)
Reason for visit:  Family history of moyamoya disease  Referring physician:  Dr. Eloise Levels Velazquez is a 50 y.o. female  History of present illness:   Katelyn Velazquez is a 50 year old right-handed Asian female with a family history of moyamoya disease. The patient has a sister who was diagnosed at age 27 with moyamoya disease after an intracranial hemorrhage. Her nephew was diagnosed at age 29 with the same issue. The patient has been doing well otherwise, she reports no headaches, vision changes, numbness or weakness of the extremities. She has not had any dizziness, gait disturbance, or difficulty controlling the bowels or the bladder. She reports no difficulty with memory. She is sent to this office for a screening procedure for familial moyamoya disease.  Past Medical History:  Diagnosis Date  . Allergy   . Moyamoya     Past Surgical History:  Procedure Laterality Date  . BREAST BIOPSY    . CESAREAN SECTION  1999    Family History  Problem Relation Age of Onset  . Hypertension Mother   . Parkinsonism Mother   . Cancer Father     prostate  . Stroke Sister 58    brain anersym   . Cancer Brother     prostate  . Colon cancer Neg Hx   . Esophageal cancer Neg Hx   . Rectal cancer Neg Hx   . Stomach cancer Neg Hx     Social history:  reports that she has never smoked. She has never used smokeless tobacco. She reports that she does not drink alcohol or use drugs.  Medications:  Prior to Admission medications   Medication Sig Start Date End Date Taking? Authorizing Provider  fluticasone (FLONASE) 50 MCG/ACT nasal spray Place 2 sprays into both nostrils daily.   Yes Historical Provider, MD  loratadine (CLARITIN) 10 MG tablet Take 10 mg by mouth daily.   Yes Historical Provider, MD  Multiple Vitamin (MULTIVITAMIN) tablet Take 1 tablet by mouth daily.   Yes Historical Provider, MD     No Known Allergies  ROS:  Out of a complete 14 system review of symptoms, the  patient complains only of the following symptoms, and all other reviewed systems are negative.   Cold intolerance  Blood pressure 128/76, pulse 64, height 5\' 1"  (1.549 m), weight 103 lb (46.7 kg).  Physical Exam  General: The patient is alert and cooperative at the time of the examination.  Eyes: Pupils are equal, round, and reactive to light. Discs are flat bilaterally.  Neck: The neck is supple, no carotid bruits are noted.  Respiratory: The respiratory examination is clear.  Cardiovascular: The cardiovascular examination reveals a regular rate and rhythm, no obvious murmurs or rubs are noted.  Skin: Extremities are without significant edema.  Neurologic Exam  Mental status: The patient is alert and oriented x 3 at the time of the examination. The patient has apparent normal recent and remote memory, with an apparently normal attention span and concentration ability.  Cranial nerves: Facial symmetry is present. There is good sensation of the face to pinprick and soft touch bilaterally. The strength of the facial muscles and the muscles to head turning and shoulder shrug are normal bilaterally. Speech is well enunciated, no aphasia or dysarthria is noted. Extraocular movements are full. Visual fields are full. The tongue is midline, and the patient has symmetric elevation of the soft palate. No obvious hearing deficits are noted.  Motor: The motor testing reveals 5 over 5  strength of all 4 extremities. Good symmetric motor tone is noted throughout.  Sensory: Sensory testing is intact to pinprick, soft touch, vibration sensation, and position sense on all 4 extremities. No evidence of extinction is noted.  Coordination: Cerebellar testing reveals good finger-nose-finger and heel-to-shin bilaterally.  Gait and station: Gait is normal. Tandem gait is normal. Romberg is negative. No drift is seen.  Reflexes: Deep tendon reflexes are symmetric and normal bilaterally. Toes are downgoing  bilaterally.   Assessment/Plan:   1. Family history of moyamoya disease   The patient will be sent for MRA of the head, if this is unremarkable, I would not recommend any further screening studies in the future. 95% of individuals with moyamoya disease are symptomatic prior to age 57.  Jill Alexanders MD 01/30/2016 3:05 PM  [facility] [address] [place], [place] [address]  Phone [phone] Fax [phone]

## 2016-01-31 ENCOUNTER — Encounter: Payer: Self-pay | Admitting: Women's Health

## 2016-01-31 DIAGNOSIS — I675 Moyamoya disease: Secondary | ICD-10-CM | POA: Insufficient documentation

## 2016-02-04 ENCOUNTER — Encounter: Payer: Self-pay | Admitting: Women's Health

## 2016-02-11 ENCOUNTER — Ambulatory Visit
Admission: RE | Admit: 2016-02-11 | Discharge: 2016-02-11 | Disposition: A | Discharge status: Home / Self Care | Payer: Managed Care, Other (non HMO) | Source: Ambulatory Visit | Attending: Neurology | Admitting: Neurology

## 2016-02-11 ENCOUNTER — Telehealth: Payer: Self-pay | Admitting: Neurology

## 2016-02-11 DIAGNOSIS — I675 Moyamoya disease: Secondary | ICD-10-CM

## 2016-02-11 NOTE — Telephone Encounter (Signed)
I called the patient. No evidence of moyamoya disease was noted.   MRA head 02/11/16:  IMPRESSION:  This MR angiogram of the intracranial arteries shows the following: 1.   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 2.   No aneurysms are noted.

## 2016-02-13 ENCOUNTER — Encounter: Payer: Self-pay | Admitting: Women's Health

## 2016-07-09 ENCOUNTER — Encounter: Payer: Self-pay | Admitting: Gynecology

## 2016-10-08 DIAGNOSIS — M65332 Trigger finger, left middle finger: Secondary | ICD-10-CM | POA: Insufficient documentation

## 2016-11-12 ENCOUNTER — Ambulatory Visit (INDEPENDENT_AMBULATORY_CARE_PROVIDER_SITE_OTHER): Payer: 59 | Admitting: Women's Health

## 2016-11-12 ENCOUNTER — Encounter: Payer: Self-pay | Admitting: Women's Health

## 2016-11-12 VITALS — BP 124/80 | Ht 61.0 in | Wt 98.0 lb

## 2016-11-12 DIAGNOSIS — Z01419 Encounter for gynecological examination (general) (routine) without abnormal findings: Secondary | ICD-10-CM | POA: Diagnosis not present

## 2016-11-12 NOTE — Patient Instructions (Signed)
Health Maintenance for Postmenopausal Women Menopause is a normal process in which your reproductive ability comes to an end. This process happens gradually over a span of months to years, usually between the ages of 22 and 9. Menopause is complete when you have missed 12 consecutive menstrual periods. It is important to talk with your health care provider about some of the most common conditions that affect postmenopausal women, such as heart disease, cancer, and bone loss (osteoporosis). Adopting a healthy lifestyle and getting preventive care can help to promote your health and wellness. Those actions can also lower your chances of developing some of these common conditions. What should I know about menopause? During menopause, you may experience a number of symptoms, such as:  Moderate-to-severe hot flashes.  Night sweats.  Decrease in sex drive.  Mood swings.  Headaches.  Tiredness.  Irritability.  Memory problems.  Insomnia.  Choosing to treat or not to treat menopausal changes is an individual decision that you make with your health care provider. What should I know about hormone replacement therapy and supplements? Hormone therapy products are effective for treating symptoms that are associated with menopause, such as hot flashes and night sweats. Hormone replacement carries certain risks, especially as you become older. If you are thinking about using estrogen or estrogen with progestin treatments, discuss the benefits and risks with your health care provider. What should I know about heart disease and stroke? Heart disease, heart attack, and stroke become more likely as you age. This may be due, in part, to the hormonal changes that your body experiences during menopause. These can affect how your body processes dietary fats, triglycerides, and cholesterol. Heart attack and stroke are both medical emergencies. There are many things that you can do to help prevent heart disease  and stroke:  Have your blood pressure checked at least every 1-2 years. High blood pressure causes heart disease and increases the risk of stroke.  If you are 53-22 years old, ask your health care provider if you should take aspirin to prevent a heart attack or a stroke.  Do not use any tobacco products, including cigarettes, chewing tobacco, or electronic cigarettes. If you need help quitting, ask your health care provider.  It is important to eat a healthy diet and maintain a healthy weight. ? Be sure to include plenty of vegetables, fruits, low-fat dairy products, and lean protein. ? Avoid eating foods that are high in solid fats, added sugars, or salt (sodium).  Get regular exercise. This is one of the most important things that you can do for your health. ? Try to exercise for at least 150 minutes each week. The type of exercise that you do should increase your heart rate and make you sweat. This is known as moderate-intensity exercise. ? Try to do strengthening exercises at least twice each week. Do these in addition to the moderate-intensity exercise.  Know your numbers.Ask your health care provider to check your cholesterol and your blood glucose. Continue to have your blood tested as directed by your health care provider.  What should I know about cancer screening? There are several types of cancer. Take the following steps to reduce your risk and to catch any cancer development as early as possible. Breast Cancer  Practice breast self-awareness. ? This means understanding how your breasts normally appear and feel. ? It also means doing regular breast self-exams. Let your health care provider know about any changes, no matter how small.  If you are 40  or older, have a clinician do a breast exam (clinical breast exam or CBE) every year. Depending on your age, family history, and medical history, it may be recommended that you also have a yearly breast X-ray (mammogram).  If you  have a family history of breast cancer, talk with your health care provider about genetic screening.  If you are at high risk for breast cancer, talk with your health care provider about having an MRI and a mammogram every year.  Breast cancer (BRCA) gene test is recommended for women who have family members with BRCA-related cancers. Results of the assessment will determine the need for genetic counseling and BRCA1 and for BRCA2 testing. BRCA-related cancers include these types: ? Breast. This occurs in males or females. ? Ovarian. ? Tubal. This may also be called fallopian tube cancer. ? Cancer of the abdominal or pelvic lining (peritoneal cancer). ? Prostate. ? Pancreatic.  Cervical, Uterine, and Ovarian Cancer Your health care provider may recommend that you be screened regularly for cancer of the pelvic organs. These include your ovaries, uterus, and vagina. This screening involves a pelvic exam, which includes checking for microscopic changes to the surface of your cervix (Pap test).  For women ages 21-65, health care providers may recommend a pelvic exam and a Pap test every three years. For women ages 79-65, they may recommend the Pap test and pelvic exam, combined with testing for human papilloma virus (HPV), every five years. Some types of HPV increase your risk of cervical cancer. Testing for HPV may also be done on women of any age who have unclear Pap test results.  Other health care providers may not recommend any screening for nonpregnant women who are considered low risk for pelvic cancer and have no symptoms. Ask your health care provider if a screening pelvic exam is right for you.  If you have had past treatment for cervical cancer or a condition that could lead to cancer, you need Pap tests and screening for cancer for at least 20 years after your treatment. If Pap tests have been discontinued for you, your risk factors (such as having a new sexual partner) need to be  reassessed to determine if you should start having screenings again. Some women have medical problems that increase the chance of getting cervical cancer. In these cases, your health care provider may recommend that you have screening and Pap tests more often.  If you have a family history of uterine cancer or ovarian cancer, talk with your health care provider about genetic screening.  If you have vaginal bleeding after reaching menopause, tell your health care provider.  There are currently no reliable tests available to screen for ovarian cancer.  Lung Cancer Lung cancer screening is recommended for adults 69-62 years old who are at high risk for lung cancer because of a history of smoking. A yearly low-dose CT scan of the lungs is recommended if you:  Currently smoke.  Have a history of at least 30 pack-years of smoking and you currently smoke or have quit within the past 15 years. A pack-year is smoking an average of one pack of cigarettes per day for one year.  Yearly screening should:  Continue until it has been 15 years since you quit.  Stop if you develop a health problem that would prevent you from having lung cancer treatment.  Colorectal Cancer  This type of cancer can be detected and can often be prevented.  Routine colorectal cancer screening usually begins at  age 42 and continues through age 45.  If you have risk factors for colon cancer, your health care provider may recommend that you be screened at an earlier age.  If you have a family history of colorectal cancer, talk with your health care provider about genetic screening.  Your health care provider may also recommend using home test kits to check for hidden blood in your stool.  A small camera at the end of a tube can be used to examine your colon directly (sigmoidoscopy or colonoscopy). This is done to check for the earliest forms of colorectal cancer.  Direct examination of the colon should be repeated every  5-10 years until age 71. However, if early forms of precancerous polyps or small growths are found or if you have a family history or genetic risk for colorectal cancer, you may need to be screened more often.  Skin Cancer  Check your skin from head to toe regularly.  Monitor any moles. Be sure to tell your health care provider: ? About any new moles or changes in moles, especially if there is a change in a mole's shape or color. ? If you have a mole that is larger than the size of a pencil eraser.  If any of your family members has a history of skin cancer, especially at a Massai Hankerson age, talk with your health care provider about genetic screening.  Always use sunscreen. Apply sunscreen liberally and repeatedly throughout the day.  Whenever you are outside, protect yourself by wearing long sleeves, pants, a wide-brimmed hat, and sunglasses.  What should I know about osteoporosis? Osteoporosis is a condition in which bone destruction happens more quickly than new bone creation. After menopause, you may be at an increased risk for osteoporosis. To help prevent osteoporosis or the bone fractures that can happen because of osteoporosis, the following is recommended:  If you are 46-71 years old, get at least 1,000 mg of calcium and at least 600 mg of vitamin D per day.  If you are older than age 55 but younger than age 65, get at least 1,200 mg of calcium and at least 600 mg of vitamin D per day.  If you are older than age 54, get at least 1,200 mg of calcium and at least 800 mg of vitamin D per day.  Smoking and excessive alcohol intake increase the risk of osteoporosis. Eat foods that are rich in calcium and vitamin D, and do weight-bearing exercises several times each week as directed by your health care provider. What should I know about how menopause affects my mental health? Depression may occur at any age, but it is more common as you become older. Common symptoms of depression  include:  Low or sad mood.  Changes in sleep patterns.  Changes in appetite or eating patterns.  Feeling an overall lack of motivation or enjoyment of activities that you previously enjoyed.  Frequent crying spells.  Talk with your health care provider if you think that you are experiencing depression. What should I know about immunizations? It is important that you get and maintain your immunizations. These include:  Tetanus, diphtheria, and pertussis (Tdap) booster vaccine.  Influenza every year before the flu season begins.  Pneumonia vaccine.  Shingles vaccine.  Your health care provider may also recommend other immunizations. This information is not intended to replace advice given to you by your health care provider. Make sure you discuss any questions you have with your health care provider. Document Released: 04/04/2005  Document Revised: 08/31/2015 Document Reviewed: 11/14/2014 Elsevier Interactive Patient Education  2018 Elsevier Inc.  

## 2016-11-12 NOTE — Progress Notes (Signed)
Katelyn Velazquez [birth year] [id number]    History:    Presents for annual exam.  Last cycle June 2017, minimal hot flushes. Prior to June cycles were monthly. Husband vasectomy. Normal Pap and mammogram history. 2016 benign colon polyp 10 year follow-up. Knee surgery several weeks ago.  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   ROS:  A ROS was performed and pertinent positives and negatives are included.  Exam:  Vitals:   11/12/16 1201  BP: 124/80  Weight: 98 lb (44.5 kg)  Height: 5' 1" (1.549 m)   Body mass index is 18.52 kg/m.   General appearance:  Normal Thyroid:  Symmetrical, normal in size, without palpable masses or nodularity. Respiratory  Auscultation:  Clear without wheezing or rhonchi Cardiovascular  Auscultation:  Regular rate, without rubs, murmurs or gallops  Edema/varicosities:  Not grossly evident Abdominal  Soft,nontender, without masses, guarding or rebound.  Liver/spleen:  No organomegaly noted  Hernia:  None appreciated  Skin  Inspection:  Grossly normal   Breasts: Examined lying and sitting.     Right: Without masses, retractions, discharge or axillary adenopathy.     Left: Without masses, retractions, discharge or axillary adenopathy. Gentitourinary   Inguinal/mons:  Normal without inguinal adenopathy  External genitalia:  Normal  BUS/Urethra/Skene's glands:  Normal  Vagina:  Normal  Cervix:  Normal  Uterus:  normal in size, shape and contour.  Midline and mobile  Adnexa/parametria:     Rt: Without masses or tenderness.   Lt: Without masses or tenderness.  Anus and perineum: Normal  Digital rectal exam: Normal sphincter tone without palpated masses or tenderness  Assessment/Plan:  51 y.o. MAF G1 P1  for annual exam.     Perimenopausal/vasectomy Underweight Left knee surgery-orthopedic  follow-up scheduled Reports all normal labs at health screening at work   Plan: Menopause reviewed, instructed to call if further bleeding. SBE's, annual screening mammogram, calcium rich diet, vitamin D 2000 daily encouraged. Continue active lifestyle of regular exercise, healthy diet. Pap with HR HPV typing, new screening guidelines reviewed.   Huel Cote [facility], 2:03 PM 11/12/2016

## 2016-11-13 LAB — PAP, TP IMAGING W/ HPV RNA, RFLX HPV TYPE 16,18/45: HPV DNA High Risk: NOT DETECTED

## 2018-10-15 LAB — HEPATIC FUNCTION PANEL
ALT: 15 (ref 7–35)
AST: 19 (ref 13–35)
Alkaline Phosphatase: 66 (ref 25–125)
Bilirubin, Total: 0.7

## 2018-10-15 LAB — BASIC METABOLIC PANEL
BUN: 15 (ref 4–21)
CO2: 29 — AB (ref 13–22)
Chloride: 98 — AB (ref 99–108)
Creatinine: 0.9 (ref 0.5–1.1)
Glucose: 76
Potassium: 4.6 (ref 3.4–5.3)
Sodium: 133 — AB (ref 137–147)

## 2018-10-15 LAB — COMPREHENSIVE METABOLIC PANEL
Albumin: 4.4 (ref 3.5–5.0)
Calcium: 9.3 (ref 8.7–10.7)

## 2019-01-26 DIAGNOSIS — M7711 Lateral epicondylitis, right elbow: Secondary | ICD-10-CM | POA: Insufficient documentation

## 2019-01-26 DIAGNOSIS — M7712 Lateral epicondylitis, left elbow: Secondary | ICD-10-CM | POA: Insufficient documentation

## 2019-01-26 DIAGNOSIS — M65312 Trigger thumb, left thumb: Secondary | ICD-10-CM | POA: Insufficient documentation

## 2019-03-31 LAB — LIPID PANEL
Cholesterol: 212 — AB (ref 0–200)
HDL: 76 — AB (ref 35–70)
LDL Cholesterol: 127
Triglycerides: 46 (ref 40–160)

## 2019-07-15 ENCOUNTER — Encounter: Payer: Self-pay | Admitting: Physician Assistant

## 2019-07-15 ENCOUNTER — Other Ambulatory Visit: Payer: Self-pay

## 2019-07-15 ENCOUNTER — Ambulatory Visit (INDEPENDENT_AMBULATORY_CARE_PROVIDER_SITE_OTHER): Payer: 59 | Admitting: Physician Assistant

## 2019-07-15 VITALS — BP 100/60 | HR 50 | Temp 97.4°F | Resp 14 | Ht 61.0 in | Wt 94.0 lb

## 2019-07-15 DIAGNOSIS — I675 Moyamoya disease: Secondary | ICD-10-CM

## 2019-07-15 DIAGNOSIS — Z780 Asymptomatic menopausal state: Secondary | ICD-10-CM

## 2019-07-15 NOTE — Patient Instructions (Signed)
It was very nice meeting you today. Welcome to [facility]!  I am getting you set up for a repeat scan of the vessels of the neck and cranium to make sure no worsening of stenosis, giving your family history of moyamoya and prior MRI findings.   I am also setting you up for a repeat bone density screen.  You should be contacted to schedule both of these things within a week. If not, please let me know.  We will follow-up in 4-6 weeks for your full physical with fasting labs so we can reassess risk factors and your cholesterol level after making the changes you wanted to implement.    Bone Density Test The bone density test uses a special type of X-ray to measure the amount of calcium and other minerals in your bones. It can measure bone density in the hip and the spine. The test procedure is similar to having a regular X-ray. This test may also be called:  Bone densitometry.  Bone mineral density test.  Dual-energy X-ray absorptiometry (DEXA). You may have this test to:  Diagnose a condition that causes weak or thin bones (osteoporosis).  Screen you for osteoporosis.  Predict your risk for a broken bone (fracture).  Determine how well your osteoporosis treatment is working. Tell a health care provider about:  Any allergies you have.  All medicines you are taking, including vitamins, herbs, eye drops, creams, and over-the-counter medicines.  Any problems you or family members have had with anesthetic medicines.  Any blood disorders you have.  Any surgeries you have had.  Any medical conditions you have.  Whether you are pregnant or may be pregnant.  Any medical tests you have had within the past 14 days that used contrast material. What are the risks? Generally, this is a safe procedure. However, it does expose you to a small amount of radiation, which can slightly increase your cancer risk. What happens before the procedure?  Do not take any calcium supplements starting  24 hours before your test.  Remove all metal jewelry, eyeglasses, dental appliances, and any other metal objects. What happens during the procedure?   You will lie down on an exam table. There will be an X-ray generator below you and an imaging device above you.  Other devices, such as boxes or braces, may be used to position your body properly for the scan.  The machine will slowly scan your body. You will need to keep still.  The images will show up on a screen in the room. Images will be examined by a specialist after your test is done. The procedure may vary among health care providers and hospitals. What happens after the procedure?  It is up to you to get your test results. Ask your health care provider, or the department that is doing the test, when your results will be ready. Summary  A bone density test is an imaging test that uses a type of X-ray to measure the amount of calcium and other minerals in your bones.  The test may be used to diagnose or screen you for a condition that causes weak or thin bones (osteoporosis), predict your risk for a broken bone (fracture), or determine how well your osteoporosis treatment is working.  Do not take any calcium supplements starting 24 hours before your test.  Ask your health care provider, or the department that is doing the test, when your results will be ready. This information is not intended to replace advice given  to you by your health care provider. Make sure you discuss any questions you have with your health care provider. Document Revised: 02/26/2017 Document Reviewed: 12/15/2016 Elsevier Patient Education  [place].

## 2019-07-15 NOTE — Progress Notes (Signed)
Patient presents to clinic today to establish care.  She is transferring her care from [facility].  Records requested.  Acute Concerns: Patient denies acute concerns today.  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  Patient endorses some history of hyperlipidemia, previously on statin.  Has not taken in some time as she has been trying to maintain with her diet and supplements.  Denies any personal history of TIA symptoms or CVA.  Had a evaluation in 2017 with MRI which she was told look good overall.  Was instructed that she would need repeat imaging in 4-5 years.  Denies any other family member with history of moyamoya.  Health Maintenance: Immunizations -- UTD. Colonoscopy -- UTD 02/12/2015 Mammogram -- Followed by GYN. Endorses last 01/2019.  Had at [facility] mammography.  Will obtain records. PAP -- Followed by GYN. UTD per patient. Will get records.  Past Medical History:  Diagnosis Date  . Allergy   . Moyamoya     Past Surgical History:  Procedure Laterality Date  . BREAST BIOPSY    . CESAREAN SECTION  1999    Current Outpatient Medications on File Prior to Visit  Medication Sig Dispense Refill  . Cholecalciferol (VITAMIN D3 PO) Take 1 tablet by mouth daily.    . fluticasone (FLONASE) 50 MCG/ACT nasal spray Place 2 sprays into both nostrils daily.    Marland Kitchen loratadine (CLARITIN) 10 MG tablet Take 10 mg by mouth daily.     No current facility-administered medications on file prior to visit.    No Known Allergies  Family History  Problem Relation Age of Onset  . Hypertension Mother   . Parkinsonism Mother   . Cancer Father        prostate  . Stroke Sister 63       brain anersym   . Cancer Brother        prostate  . Colon cancer Neg Hx   . Esophageal cancer Neg Hx   . Rectal cancer Neg Hx   . Stomach cancer Neg Hx     Social History   Socioeconomic  History  . Marital status: Married    Spouse name: Not on file  . Number of children: 1  . Years of education: 45  . Highest education level: Not on file  Occupational History  . Occupation: [employer]  Tobacco Use  . Smoking status: Never Smoker  . Smokeless tobacco: Never Used  Substance and Sexual Activity  . Alcohol use: No  . Drug use: No  . Sexual activity: Yes    Birth control/protection: Other-see comments    Comment: vasectomy  Other Topics Concern  . Not on file  Social History Narrative   Lives at home w/ her husband   Right-handed   Caffeine: 2-4 cups of coffee per day   Social Determinants of Health   Financial Resource Strain:   . Difficulty of Paying Living Expenses:   Food Insecurity:   . Worried About Charity fundraiser in the Last Year:   . Arboriculturist in the Last Year:   Transportation Needs:   . Film/video editor (Medical):   Marland Kitchen Lack of Transportation (Non-Medical):   Physical Activity:   . Days of Exercise per Week:   . Minutes of Exercise per Session:   Stress:   . Feeling of Stress :   Social Connections:   . Frequency  of Communication with Friends and Family:   . Frequency of Social Gatherings with Friends and Family:   . Attends Religious Services:   . Active Member of Clubs or Organizations:   . Attends Archivist Meetings:   Marland Kitchen Marital Status:   Intimate Partner Violence:   . Fear of Current or Ex-Partner:   . Emotionally Abused:   Marland Kitchen Physically Abused:   . Sexually Abused:    ROS Pertinent ROS are listed in the HPI.  Resp 14   Ht 5\' 1"  (1.549 m)   Wt 94 lb (42.6 kg)   BMI 17.76 kg/m   Physical Exam Vitals reviewed.  Constitutional:      Appearance: Normal appearance.  HENT:     Head: Normocephalic and atraumatic.     Right Ear: Tympanic membrane normal.     Left Ear: Tympanic membrane normal.  Eyes:     Conjunctiva/sclera: Conjunctivae normal.     Pupils: Pupils are equal, round, and reactive to light.   Cardiovascular:     Rate and Rhythm: Normal rate and regular rhythm.     Pulses: Normal pulses.     Heart sounds: Normal heart sounds.  Pulmonary:     Effort: Pulmonary effort is normal.     Breath sounds: Normal breath sounds.  Musculoskeletal:     Cervical back: Neck supple.  Neurological:     General: No focal deficit present.     Mental Status: She is alert and oriented to person, place, and time.  Psychiatric:        Mood and Affect: Mood normal.    Assessment/Plan: 1.  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  Given family history of moyamoya, needs follow-up imaging to ensure no progression.  We will also need to make sure patient's blood pressure, cholesterol and glucose are remaining under control.  She would like to defer labs today, returning in 4 weeks for full physical with fasting labs.  We will update blood work at that time to further risk stratify. - MR Angiogram Head Wo Contrast; Future  2. Postmenopausal estrogen deficiency Order for DEXA placed.  Continue calcium and vitamin D supplementation. - DG Bone Density; Future  This visit occurred during the SARS-CoV-2 public health emergency.  Safety protocols were in place, including screening questions prior to the visit, additional usage of staff PPE, and extensive cleaning of exam room while observing appropriate contact time as indicated for disinfecting solutions.     Leeanne Rio, PA-C

## 2019-07-26 ENCOUNTER — Other Ambulatory Visit: Payer: Self-pay

## 2019-07-26 ENCOUNTER — Encounter: Payer: Self-pay | Admitting: Nurse Practitioner

## 2019-07-26 ENCOUNTER — Ambulatory Visit (INDEPENDENT_AMBULATORY_CARE_PROVIDER_SITE_OTHER): Payer: 59 | Admitting: Nurse Practitioner

## 2019-07-26 VITALS — BP 118/80 | Ht 61.0 in | Wt 96.0 lb

## 2019-07-26 DIAGNOSIS — Z780 Asymptomatic menopausal state: Secondary | ICD-10-CM

## 2019-07-26 DIAGNOSIS — Z01419 Encounter for gynecological examination (general) (routine) without abnormal findings: Secondary | ICD-10-CM | POA: Diagnosis not present

## 2019-07-26 NOTE — Addendum Note (Signed)
Addended by: Lorine Bears on: 07/26/2019 04:23 PM   Modules accepted: Orders

## 2019-07-26 NOTE — Patient Instructions (Signed)
Health Maintenance, Female Adopting a healthy lifestyle and getting preventive care are important in promoting health and wellness. Ask your health care provider about:  The right schedule for you to have regular tests and exams.  Things you can do on your own to prevent diseases and keep yourself healthy. What should I know about diet, weight, and exercise? Eat a healthy diet   Eat a diet that includes plenty of vegetables, fruits, low-fat dairy products, and lean protein.  Do not eat a lot of foods that are high in solid fats, added sugars, or sodium. Maintain a healthy weight Body mass index (BMI) is used to identify weight problems. It estimates body fat based on height and weight. Your health care provider can help determine your BMI and help you achieve or maintain a healthy weight. Get regular exercise Get regular exercise. This is one of the most important things you can do for your health. Most adults should:  Exercise for at least 150 minutes each week. The exercise should increase your heart rate and make you sweat (moderate-intensity exercise).  Do strengthening exercises at least twice a week. This is in addition to the moderate-intensity exercise.  Spend less time sitting. Even light physical activity can be beneficial. Watch cholesterol and blood lipids Have your blood tested for lipids and cholesterol at 56 years of age, then have this test every 5 years. Have your cholesterol levels checked more often if:  Your lipid or cholesterol levels are high.  You are older than 56 years of age.  You are at high risk for heart disease. What should I know about cancer screening? Depending on your health history and family history, you may need to have cancer screening at various ages. This may include screening for:  Breast cancer.  Cervical cancer.  Colorectal cancer.  Skin cancer.  Lung cancer. What should I know about heart disease, diabetes, and high blood  pressure? Blood pressure and heart disease  High blood pressure causes heart disease and increases the risk of stroke. This is more likely to develop in people who have high blood pressure readings, are of African descent, or are overweight.  Have your blood pressure checked: ? Every 3-5 years if you are 18-39 years of age. ? Every year if you are 40 years old or older. Diabetes Have regular diabetes screenings. This checks your fasting blood sugar level. Have the screening done:  Once every three years after age 40 if you are at a normal weight and have a low risk for diabetes.  More often and at a younger age if you are overweight or have a high risk for diabetes. What should I know about preventing infection? Hepatitis B If you have a higher risk for hepatitis B, you should be screened for this virus. Talk with your health care provider to find out if you are at risk for hepatitis B infection. Hepatitis C Testing is recommended for:  Everyone born from 1945 through 1965.  Anyone with known risk factors for hepatitis C. Sexually transmitted infections (STIs)  Get screened for STIs, including gonorrhea and chlamydia, if: ? You are sexually active and are younger than 56 years of age. ? You are older than 56 years of age and your health care provider tells you that you are at risk for this type of infection. ? Your sexual activity has changed since you were last screened, and you are at increased risk for chlamydia or gonorrhea. Ask your health care provider if   you are at risk.  Ask your health care provider about whether you are at high risk for HIV. Your health care provider may recommend a prescription medicine to help prevent HIV infection. If you choose to take medicine to prevent HIV, you should first get tested for HIV. You should then be tested every 3 months for as long as you are taking the medicine. Pregnancy  If you are about to stop having your period (premenopausal) and  you may become pregnant, seek counseling before you get pregnant.  Take 400 to 800 micrograms (mcg) of folic acid every day if you become pregnant.  Ask for birth control (contraception) if you want to prevent pregnancy. Osteoporosis and menopause Osteoporosis is a disease in which the bones lose minerals and strength with aging. This can result in bone fractures. If you are 65 years old or older, or if you are at risk for osteoporosis and fractures, ask your health care provider if you should:  Be screened for bone loss.  Take a calcium or vitamin D supplement to lower your risk of fractures.  Be given hormone replacement therapy (HRT) to treat symptoms of menopause. Follow these instructions at home: Lifestyle  Do not use any products that contain nicotine or tobacco, such as cigarettes, e-cigarettes, and chewing tobacco. If you need help quitting, ask your health care provider.  Do not use street drugs.  Do not share needles.  Ask your health care provider for help if you need support or information about quitting drugs. Alcohol use  Do not drink alcohol if: ? Your health care provider tells you not to drink. ? You are pregnant, may be pregnant, or are planning to become pregnant.  If you drink alcohol: ? Limit how much you use to 0-1 drink a day. ? Limit intake if you are breastfeeding.  Be aware of how much alcohol is in your drink. In the [place] one drink equals one 12 oz bottle of beer (355 mL), one 5 oz glass of wine (148 mL), or one 1 oz glass of hard liquor (44 mL). General instructions  Schedule regular health, dental, and eye exams.  Stay current with your vaccines.  Tell your health care provider if: ? You often feel depressed. ? You have ever been abused or do not feel safe at home. Summary  Adopting a healthy lifestyle and getting preventive care are important in promoting health and wellness.  Follow your health care provider's instructions about healthy  diet, exercising, and getting tested or screened for diseases.  Follow your health care provider's instructions on monitoring your cholesterol and blood pressure. This information is not intended to replace advice given to you by your health care provider. Make sure you discuss any questions you have with your health care provider. Document Revised: 02/03/2018 Document Reviewed: 02/03/2018 Elsevier Patient Education  2020 [employer]  

## 2019-07-26 NOTE — Progress Notes (Signed)
   Katelyn Velazquez 08-Sep-1965 [id number]   History:  54 y.o. G1 P1 presents for annual exam without GYN complaints.  Postmenopausal -no HRT, no bleeding.  Reports history of ASCUS, negative HPV. We do not have these records.   Gynecologic History No LMP recorded. Patient is perimenopausal.   Contraception: post menopausal status and vasectomy Last Pap: 11/12/2016. Results were: normal Last mammogram: 01/2019 per patient. Results were: normal Last colonoscopy: 01/2015. Results were: normal  Past medical history, past surgical history, family history and social history were all reviewed and documented in the EPIC chart. 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   ROS:  A ROS was performed and pertinent positives and negatives are included.  Exam:  Vitals:   07/26/19 1126  BP: 118/80  Weight: 96 lb (43.5 kg)  Height: 5\' 1"  (1.549 m)   Body mass index is 18.14 kg/m.  General appearance:  Normal Thyroid:  Symmetrical, normal in size, without palpable masses or nodularity. Respiratory  Auscultation:  Clear without wheezing or rhonchi Cardiovascular  Auscultation:  Regular rate, without rubs, murmurs or gallops  Edema/varicosities:  Not grossly evident Abdominal  Soft,nontender, without masses, guarding or rebound.  Liver/spleen:  No organomegaly noted  Hernia:  None appreciated  Skin  Inspection:  Grossly normal   Breasts: Examined lying and sitting.   Right: Without masses, retractions, discharge or axillary adenopathy.   Left: Without masses, retractions, discharge or axillary adenopathy. Gentitourinary   Inguinal/mons:  Normal without inguinal adenopathy  External genitalia:  Normal  BUS/Urethra/Skene's glands:  Normal  Vagina:  Normal  Cervix:  Normal  Uterus: Normal in size, shape and contour.  Midline and mobile  Adnexa/parametria:     Rt: Without masses or tenderness.   Lt: Without masses or tenderness.  Anus and perineum: Normal  Digital rectal  exam: Normal sphincter tone without palpated masses or tenderness  Assessment/Plan:  54 y.o.  for annual exam.   Well female exam with routine gynecological exam - Education provided on SBEs, importance of preventative screenings, current guidelines, high calcium diet, regular exercise, and multivitamin daily. Pap with HPV typing today.   Postmenopausal - no HRT, minimal symptoms  Follow up in 1 year for annual     Miguel Barrera, 11:42 AM 07/26/2019

## 2019-07-27 ENCOUNTER — Encounter: Payer: Self-pay | Admitting: Physician Assistant

## 2019-07-28 ENCOUNTER — Encounter: Payer: Self-pay | Admitting: Emergency Medicine

## 2019-07-28 LAB — PAP, TP IMAGING W/ HPV RNA, RFLX HPV TYPE 16,18/45: HPV DNA High Risk: NOT DETECTED

## 2019-08-01 ENCOUNTER — Other Ambulatory Visit: Payer: Self-pay | Admitting: Physician Assistant

## 2019-08-01 DIAGNOSIS — Z780 Asymptomatic menopausal state: Secondary | ICD-10-CM

## 2019-08-03 ENCOUNTER — Other Ambulatory Visit: Payer: Self-pay

## 2019-08-03 ENCOUNTER — Ambulatory Visit
Admission: RE | Admit: 2019-08-03 | Discharge: 2019-08-03 | Disposition: A | Discharge status: Home / Self Care | Payer: 59 | Source: Ambulatory Visit | Attending: Physician Assistant | Admitting: Physician Assistant

## 2019-08-03 DIAGNOSIS — Z780 Asymptomatic menopausal state: Secondary | ICD-10-CM

## 2019-08-09 ENCOUNTER — Telehealth: Payer: Self-pay | Admitting: Physician Assistant

## 2019-08-09 ENCOUNTER — Other Ambulatory Visit: Payer: Self-pay

## 2019-08-09 ENCOUNTER — Ambulatory Visit: Payer: 59

## 2019-08-09 DIAGNOSIS — Z780 Asymptomatic menopausal state: Secondary | ICD-10-CM

## 2019-08-09 NOTE — Telephone Encounter (Signed)
Lab orders put in for patient. Appointment scheduled for 08-09-2019.

## 2019-08-09 NOTE — Telephone Encounter (Signed)
Pt called into schedule an lab appt, I don't see any lab orders entered, please advise on what labs are needed. Pt can be reached at the home #

## 2019-08-10 ENCOUNTER — Ambulatory Visit (INDEPENDENT_AMBULATORY_CARE_PROVIDER_SITE_OTHER): Payer: 59

## 2019-08-10 ENCOUNTER — Other Ambulatory Visit: Payer: Self-pay

## 2019-08-10 DIAGNOSIS — Z780 Asymptomatic menopausal state: Secondary | ICD-10-CM | POA: Diagnosis not present

## 2019-08-10 LAB — VITAMIN D 25 HYDROXY (VIT D DEFICIENCY, FRACTURES): VITD: 60.94 ng/mL (ref 30.00–100.00)

## 2019-08-12 LAB — TIQ-MISC

## 2019-08-16 ENCOUNTER — Other Ambulatory Visit: Payer: Self-pay | Admitting: Physician Assistant

## 2019-08-16 LAB — EXTRA SPECIMEN

## 2019-08-16 LAB — PTH, INTACT AND CALCIUM
Calcium: 9.3 mg/dL (ref 8.6–10.4)
PTH: 34 pg/mL (ref 14–64)

## 2019-08-16 MED ORDER — ALENDRONATE SODIUM 70 MG PO TABS
70.0000 mg | ORAL_TABLET | ORAL | 11 refills | Status: DC
Start: 2019-08-16 — End: 2020-07-18

## 2019-08-16 NOTE — Progress Notes (Signed)
Notified patient of lab results.Patient voices understanding.  

## 2019-08-22 ENCOUNTER — Ambulatory Visit
Admission: RE | Admit: 2019-08-22 | Discharge: 2019-08-22 | Disposition: A | Discharge status: Home / Self Care | Payer: 59 | Source: Ambulatory Visit | Attending: Physician Assistant | Admitting: Physician Assistant

## 2019-08-22 ENCOUNTER — Other Ambulatory Visit: Payer: Self-pay

## 2019-08-22 DIAGNOSIS — I675 Moyamoya disease: Secondary | ICD-10-CM

## 2019-08-23 ENCOUNTER — Encounter: Payer: Self-pay | Admitting: Physician Assistant

## 2019-08-23 ENCOUNTER — Ambulatory Visit (INDEPENDENT_AMBULATORY_CARE_PROVIDER_SITE_OTHER): Payer: 59 | Admitting: Physician Assistant

## 2019-08-23 ENCOUNTER — Other Ambulatory Visit: Payer: Self-pay | Admitting: Physician Assistant

## 2019-08-23 VITALS — BP 112/62 | HR 97 | Resp 18 | Ht 61.0 in | Wt 94.2 lb

## 2019-08-23 DIAGNOSIS — M8949 Other hypertrophic osteoarthropathy, multiple sites: Secondary | ICD-10-CM | POA: Diagnosis not present

## 2019-08-23 DIAGNOSIS — Z0000 Encounter for general adult medical examination without abnormal findings: Secondary | ICD-10-CM | POA: Insufficient documentation

## 2019-08-23 DIAGNOSIS — I675 Moyamoya disease: Secondary | ICD-10-CM

## 2019-08-23 DIAGNOSIS — M810 Age-related osteoporosis without current pathological fracture: Secondary | ICD-10-CM

## 2019-08-23 DIAGNOSIS — M159 Polyosteoarthritis, unspecified: Secondary | ICD-10-CM | POA: Insufficient documentation

## 2019-08-23 LAB — CBC WITH DIFFERENTIAL/PLATELET
Basophils Absolute: 0 10*3/uL (ref 0.0–0.1)
Basophils Relative: 0.2 % (ref 0.0–3.0)
Eosinophils Absolute: 0 10*3/uL (ref 0.0–0.7)
Eosinophils Relative: 0 % (ref 0.0–5.0)
HCT: 40.2 % (ref 36.0–46.0)
Hemoglobin: 13.7 g/dL (ref 12.0–15.0)
Lymphocytes Relative: 12.3 % (ref 12.0–46.0)
Lymphs Abs: 0.5 10*3/uL — ABNORMAL LOW (ref 0.7–4.0)
MCHC: 34.1 g/dL (ref 30.0–36.0)
MCV: 99 fl (ref 78.0–100.0)
Monocytes Absolute: 0.1 10*3/uL (ref 0.1–1.0)
Monocytes Relative: 3.7 % (ref 3.0–12.0)
Neutro Abs: 3.3 10*3/uL (ref 1.4–7.7)
Neutrophils Relative %: 83.8 % — ABNORMAL HIGH (ref 43.0–77.0)
Platelets: 195 10*3/uL (ref 150.0–400.0)
RBC: 4.06 Mil/uL (ref 3.87–5.11)
RDW: 12.8 % (ref 11.5–15.5)
WBC: 4 10*3/uL (ref 4.0–10.5)

## 2019-08-23 LAB — COMPREHENSIVE METABOLIC PANEL
ALT: 12 U/L (ref 0–35)
AST: 16 U/L (ref 0–37)
Albumin: 4.8 g/dL (ref 3.5–5.2)
Alkaline Phosphatase: 83 U/L (ref 39–117)
BUN: 12 mg/dL (ref 6–23)
CO2: 27 mEq/L (ref 19–32)
Calcium: 9.4 mg/dL (ref 8.4–10.5)
Chloride: 96 mEq/L (ref 96–112)
Creatinine, Ser: 0.72 mg/dL (ref 0.40–1.20)
GFR: 83.87 mL/min (ref >60.00)
Glucose, Bld: 108 mg/dL — ABNORMAL HIGH (ref 70–99)
Potassium: 4.7 mEq/L (ref 3.5–5.1)
Sodium: 132 mEq/L — ABNORMAL LOW (ref 135–145)
Total Bilirubin: 0.6 mg/dL (ref 0.2–1.2)
Total Protein: 7.3 g/dL (ref 6.0–8.3)

## 2019-08-23 LAB — LIPID PANEL
Cholesterol: 233 mg/dL — ABNORMAL HIGH (ref 0–200)
HDL: 87.4 mg/dL (ref >39.00)
LDL Cholesterol: 140 mg/dL — ABNORMAL HIGH (ref 0–99)
NonHDL: 145.93
Total CHOL/HDL Ratio: 3
Triglycerides: 30 mg/dL (ref 0.0–149.0)
VLDL: 6 mg/dL (ref 0.0–40.0)

## 2019-08-23 LAB — HEMOGLOBIN A1C: Hgb A1c MFr Bld: 5.6 % (ref 4.6–6.5)

## 2019-08-23 LAB — TSH: TSH: 1.01 u[IU]/mL (ref 0.35–4.50)

## 2019-08-23 MED ORDER — MELOXICAM 15 MG PO TABS
15.0000 mg | ORAL_TABLET | Freq: Every day | ORAL | 0 refills | Status: DC
Start: 2019-08-23 — End: 2020-07-18

## 2019-08-23 MED ORDER — ATORVASTATIN CALCIUM 10 MG PO TABS
10.0000 mg | ORAL_TABLET | Freq: Every day | ORAL | 2 refills | Status: DC
Start: 2019-08-23 — End: 2019-11-17

## 2019-08-23 NOTE — Addendum Note (Signed)
Addended by: Brunetta Jeans on: 08/23/2019 03:45 PM   Modules accepted: Orders

## 2019-08-23 NOTE — Patient Instructions (Addendum)
Please go to the lab for blood work.   Our office will call you with your results unless you have chosen to receive results via MyChart.  If your blood work is normal we will follow-up each year for physicals and as scheduled for chronic medical problems.  If anything is abnormal we will treat accordingly and get you in for a follow-up.  Use the Meloxicam as directed when needed for worse days.  Follow-up with Orthopedics as scheduled.   Make sure to call [facility] at  [phone] to get your disc sent to the [place] clinic at [facility].    Preventive Care 56-64 Years Old, Female Preventive care refers to visits with your health care provider and lifestyle choices that can promote health and wellness. This includes:  A yearly physical exam. This may also be called an annual well check.  Regular dental visits and eye exams.  Immunizations.  Screening for certain conditions.  Healthy lifestyle choices, such as eating a healthy diet, getting regular exercise, not using drugs or products that contain nicotine and tobacco, and limiting alcohol use. What can I expect for my preventive care visit? Physical exam Your health care provider will check your:  Height and weight. This may be used to calculate body mass index (BMI), which tells if you are at a healthy weight.  Heart rate and blood pressure.  Skin for abnormal spots. Counseling Your health care provider may ask you questions about your:  Alcohol, tobacco, and drug use.  Emotional well-being.  Home and relationship well-being.  Sexual activity.  Eating habits.  Work and work Statistician.  Method of birth control.  Menstrual cycle.  Pregnancy history. What immunizations do I need?  Influenza (flu) vaccine  This is recommended every year. Tetanus, diphtheria, and pertussis (Tdap) vaccine  You may need a Td booster every 10 years. Varicella (chickenpox) vaccine  You may need this if you have not  been vaccinated. Zoster (shingles) vaccine  You may need this after age 56. Measles, mumps, and rubella (MMR) vaccine  You may need at least one dose of MMR if you were born in 1957 or later. You may also need a second dose. Pneumococcal conjugate (PCV13) vaccine  You may need this if you have certain conditions and were not previously vaccinated. Pneumococcal polysaccharide (PPSV23) vaccine  You may need one or two doses if you smoke cigarettes or if you have certain conditions. Meningococcal conjugate (MenACWY) vaccine  You may need this if you have certain conditions. Hepatitis A vaccine  You may need this if you have certain conditions or if you travel or work in places where you may be exposed to hepatitis A. Hepatitis B vaccine  You may need this if you have certain conditions or if you travel or work in places where you may be exposed to hepatitis B. Haemophilus influenzae type b (Hib) vaccine  You may need this if you have certain conditions. Human papillomavirus (HPV) vaccine  If recommended by your health care provider, you may need three doses over 6 months. You may receive vaccines as individual doses or as more than one vaccine together in one shot (combination vaccines). Talk with your health care provider about the risks and benefits of combination vaccines. What tests do I need? Blood tests  Lipid and cholesterol levels. These may be checked every 5 years, or more frequently if you are over 31 years old.  Hepatitis C test.  Hepatitis B test. Screening  Lung cancer screening. You  may have this screening every year starting at age 56 if you have a 30-pack-year history of smoking and currently smoke or have quit within the past 15 years.  Colorectal cancer screening. All adults should have this screening starting at age 56 and continuing until age 21. Your health care provider may recommend screening at age 56 if you are at increased risk. You will have tests  every 1-10 years, depending on your results and the type of screening test.  Diabetes screening. This is done by checking your blood sugar (glucose) after you have not eaten for a while (fasting). You may have this done every 1-3 years.  Mammogram. This may be done every 1-2 years. Talk with your health care provider about when you should start having regular mammograms. This may depend on whether you have a family history of breast cancer.  BRCA-related cancer screening. This may be done if you have a family history of breast, ovarian, tubal, or peritoneal cancers.  Pelvic exam and Pap test. This may be done every 3 years starting at age 56. Starting at age 37, this may be done every 5 years if you have a Pap test in combination with an HPV test. Other tests  Sexually transmitted disease (STD) testing.  Bone density scan. This is done to screen for osteoporosis. You may have this scan if you are at high risk for osteoporosis. Follow these instructions at home: Eating and drinking  Eat a diet that includes fresh fruits and vegetables, whole grains, lean protein, and low-fat dairy.  Take vitamin and mineral supplements as recommended by your health care provider.  Do not drink alcohol if: ? Your health care provider tells you not to drink. ? You are pregnant, may be pregnant, or are planning to become pregnant.  If you drink alcohol: ? Limit how much you have to 0-1 drink a day. ? Be aware of how much alcohol is in your drink. In the [place] one drink equals one 12 oz bottle of beer (355 mL), one 5 oz glass of wine (148 mL), or one 1 oz glass of hard liquor (44 mL). Lifestyle  Take daily care of your teeth and gums.  Stay active. Exercise for at least 30 minutes on 5 or more days each week.  Do not use any products that contain nicotine or tobacco, such as cigarettes, e-cigarettes, and chewing tobacco. If you need help quitting, ask your health care provider.  If you are sexually  active, practice safe sex. Use a condom or other form of birth control (contraception) in order to prevent pregnancy and STIs (sexually transmitted infections).  If told by your health care provider, take low-dose aspirin daily starting at age 31. What's next?  Visit your health care provider once a year for a well check visit.  Ask your health care provider how often you should have your eyes and teeth checked.  Stay up to date on all vaccines. This information is not intended to replace advice given to you by your health care provider. Make sure you discuss any questions you have with your health care provider. Document Revised: 10/22/2017 Document Reviewed: 10/22/2017 Elsevier Patient Education  2020 [employer].

## 2019-08-23 NOTE — Progress Notes (Signed)
Patient presents to clinic today for annual exam.  Patient is fasting for labs.  Patient also recently had repeat bone density scan due to history of osteopenia.  DEXA revealed T score -3.1, placing patient in osteoporotic category.  Other labs including renal function, vitamin D and PTH all within normal range.  As such patient was started on Fosamax.  Endorses taking as directed and tolerating well without noted side effect.  Continues her OTC vitamin D and calcium supplementation.  Patient also recently had updated MRI of her brain given family history of moyamoya and previous mild abnormalities found on brain imaging.  Patient would like to discuss results today.  Health Maintenance: Immunizations -- UTD Colonoscopy -- UTD Mammogram -- Repeat Winter 2021 PAP -- UTD  Past Medical History:  Diagnosis Date  . Allergy   . Arthritis   . Family history of Moyamoya   . Hyperlipidemia     Past Surgical History:  Procedure Laterality Date  . BREAST BIOPSY    . CESAREAN SECTION  1999  . KNEE ARTHROSCOPY  2018, 2019    Current Outpatient Medications on File Prior to Visit  Medication Sig Dispense Refill  . alendronate (FOSAMAX) 70 MG tablet Take 1 tablet (70 mg total) by mouth every 7 (seven) days. Take with a full glass of water on an empty stomach. 4 tablet 11  . calcium-vitamin D (OSCAL WITH D) 500-200 MG-UNIT tablet Take 2 tablets by mouth.    . diclofenac Sodium (VOLTAREN) 1 % GEL Apply topically 4 (four) times daily.    Marland Kitchen loratadine (CLARITIN) 10 MG tablet Take 10 mg by mouth daily.    . Turmeric 1053 MG TABS Take 1 tablet by mouth daily.     No current facility-administered medications on file prior to visit.    No Known Allergies  Family History  Problem Relation Age of Onset  . Hypertension Mother   . Parkinsonism Mother   . Arthritis Mother   . Cancer Father        prostate  . Stroke Sister 58       brain anersym   . Early death Sister   . Cancer Brother         prostate  . Alcohol abuse Brother   . Depression Brother   . Hyperlipidemia Brother   . Colon cancer Neg Hx   . Esophageal cancer Neg Hx   . Rectal cancer Neg Hx   . Stomach cancer Neg Hx     Social History   Socioeconomic History  . Marital status: Married    Spouse name: Not on file  . Number of children: 1  . Years of education: 19  . Highest education level: Not on file  Occupational History  . Occupation: [employer]  Tobacco Use  . Smoking status: Never Smoker  . Smokeless tobacco: Never Used  Vaping Use  . Vaping Use: Never used  Substance and Sexual Activity  . Alcohol use: No  . Drug use: No  . Sexual activity: Yes    Birth control/protection: Other-see comments    Comment: vasectomy  Other Topics Concern  . Not on file  Social History Narrative   Lives at home w/ her husband   Right-handed   Caffeine: 2-4 cups of coffee per day   Social Determinants of Health   Financial Resource Strain:   . Difficulty of Paying Living Expenses:   Food Insecurity:   . Worried About Charity fundraiser in the  Last Year:   . [place] in the Last Year:   Transportation Needs:   . Film/video editor (Medical):   Marland Kitchen Lack of Transportation (Non-Medical):   Physical Activity:   . Days of Exercise per Week:   . Minutes of Exercise per Session:   Stress:   . Feeling of Stress :   Social Connections:   . Frequency of Communication with Friends and Family:   . Frequency of Social Gatherings with Friends and Family:   . Attends Religious Services:   . Active Member of Clubs or Organizations:   . Attends Archivist Meetings:   Marland Kitchen Marital Status:   Intimate Partner Violence:   . Fear of Current or Ex-Partner:   . Emotionally Abused:   Marland Kitchen Physically Abused:   . Sexually Abused:    Review of Systems  Constitutional: Negative for fever and weight loss.  HENT: Negative for ear discharge, ear pain, hearing loss and tinnitus.   Eyes: Negative for blurred  vision, double vision, photophobia and pain.  Respiratory: Negative for cough and shortness of breath.   Cardiovascular: Negative for chest pain and palpitations.  Gastrointestinal: Negative for abdominal pain, blood in stool, constipation, diarrhea, heartburn, melena, nausea and vomiting.  Genitourinary: Negative for dysuria, flank pain, frequency, hematuria and urgency.  Musculoskeletal: Negative for falls.  Neurological: Negative for dizziness, loss of consciousness and headaches.  Endo/Heme/Allergies: Negative for environmental allergies.  Psychiatric/Behavioral: Negative for depression, hallucinations, substance abuse and suicidal ideas. The patient is not nervous/anxious and does not have insomnia.    Ht 5\' 1"  (1.549 m)   Wt 94 lb 3.2 oz (42.7 kg)   BMI 17.80 kg/m   Physical Exam Vitals reviewed.  HENT:     Head: Normocephalic and atraumatic.     Right Ear: Tympanic membrane, ear canal and external ear normal.     Left Ear: Tympanic membrane, ear canal and external ear normal.     Nose: Nose normal. No mucosal edema.     Mouth/Throat:     Pharynx: Uvula midline. No oropharyngeal exudate or posterior oropharyngeal erythema.  Eyes:     Conjunctiva/sclera: Conjunctivae normal.     Pupils: Pupils are equal, round, and reactive to light.  Neck:     Thyroid: No thyromegaly.  Cardiovascular:     Rate and Rhythm: Normal rate and regular rhythm.     Heart sounds: Normal heart sounds.  Pulmonary:     Effort: Pulmonary effort is normal. No respiratory distress.     Breath sounds: Normal breath sounds. No wheezing or rales.  Abdominal:     General: Bowel sounds are normal. There is no distension.     Palpations: Abdomen is soft. There is no mass.     Tenderness: There is no abdominal tenderness. There is no guarding or rebound.  Musculoskeletal:     Cervical back: Neck supple.  Lymphadenopathy:     Cervical: No cervical adenopathy.  Skin:    General: Skin is warm and dry.      Findings: No rash.  Neurological:     Mental Status: She is alert and oriented to person, place, and time.     Cranial Nerves: No cranial nerve deficit.     Recent Results (from the past 2160 hour(s))  PAP,TP IMGw/HPV RNA,rflx LZJQBHA19,37/90     Status: None   Collection Time: 07/26/19  4:25 PM  Result Value Ref Range   Clinical Information:      Comment: PERIMENOPAUSAL  LMP:      Comment: NONE GIVEN   PREV. PAP:      Comment: NONE GIVEN   PREV. BX:      Comment: NONE GIVEN   HPV DNA Probe-Source      Comment: Endocervix   STATEMENT OF ADEQUACY:      Comment: SATISFACTORY FOR EVALUATION   INTERPRETATION/RESULT:      Comment: Negative for intraepithelial lesion or malignancy. Reactive cellular changes associated with repair Atrophic pattern; predominantly parabasal cells    Comment:      Comment: This Pap test has been evaluated with computer assisted technology. Suggest clinical correlation and follow-up as clinically appropriate Key portions of this case have been reviewed by  one or more pathologists.    CYTOTECHNOLOGIST:      Comment: JWW, CT(ASCP) CT screening location: [address], [place], [place] [address]    PATHOLOGIST:      Comment: Sukru S. Demirci, MD, Board Certification in Anatomic/Clinical Pathology and Cytopathology Electronically Signed    HPV DNA High Risk Not Detected Not Detect    Comment: Methodology: Transcription-Mediated Amplification . This assay detects E6/E7 viral messenger RNA (mRNA) from 14 high-risk HPV types (16,18,31,33,35,39,45,51,52,56,58,59,66,68). . . The analytical performance characteristics of this assay have been determined by [facility]. The modifications have not been cleared or approved by the FDA. This assay has been validated pursuant to the CLIA regulations and is used for clinical purposes. . For additional information, please refer  to http://education.[facility].com/faq/FAQ129v1 (This link if provided for information/ educational purposes only.) EXPLANATORY NOTE:  . The Pap is a screening test for cervical cancer. It is  not a diagnostic test and is subject to false negative  and false positive results. It is most reliable when a  satisfactory sample, regularly obtained, is submitted  with relevant clinical findings and history, and when  the Pap result is evaluated along with historic and  current clinica l information. .   Vitamin D (25 hydroxy)     Status: None   Collection Time: 08/10/19  2:24 PM  Result Value Ref Range   VITD 60.94 30.00 - 100.00 ng/mL  PTH, Intact and Calcium     Status: None   Collection Time: 08/10/19  2:24 PM  Result Value Ref Range   PTH 34 14 - 64 pg/mL    Comment: . Interpretive Guide    Intact PTH           Calcium ------------------    ----------           ------- Normal Parathyroid    Normal               Normal Hypoparathyroidism    Low or Low Normal    Low Hyperparathyroidism    Primary            Normal or High       High    Secondary          High                 Normal or Low    Tertiary           High                 High Non-Parathyroid    Hypercalcemia      Low or Low Normal    High .    Calcium 9.3 8.6 - 10.4 mg/dL  Extra Specimen     Status: None  Collection Time: 08/10/19  2:24 PM  Result Value Ref Range   Extra tube recieved      Comment: An extra specimen was received with no test requested. The specimen will be maintained in storage in case  additional testing is needed. Please call the client service department for further assistance. Marland Kitchen    Specimen type recieved Frozen, Vial   TIQ-MISC     Status: None   Collection Time: 08/10/19  2:24 PM  Result Value Ref Range   QUESTION/PROBLEM:      Comment: . Please clarify the following specimen source/type submitted. .    QUESTION: VERIFY FZ SOURCE     Comment: REQUESTED INFORMATION  _________________________________ . AUTHORIZED SIGNATURE __________________________________ . TO PREVENT FURTHER DELAYS IN TESTING, PLEASE COMPLETE INFORMATION ABOVE AND EITHER FAX TO [phone] OR  EMAIL TO [email or web address] TO  RESOLVE THIS ORDER.     Assessment/Plan: 1. Visit for preventive health examination Depression screen negative. Health Maintenance reviewed. Preventive schedule discussed and handout given in AVS. Will obtain fasting labs today.  - CBC with Differential/Platelet - Comprehensive metabolic panel - Lipid panel - Hemoglobin A1c - TSH  2. Primary osteoarthritis involving multiple joints Continue care per specialist.  Will give her a short prescription for meloxicam to use on an as-needed basis for "bad" days. She wants to avoid daily medication at all cost. Suspect she will need to proceed with arthroscopic cleaning of her knees if not TKR.  3. Osteoporosis without current pathological fracture, unspecified osteoporosis type Tolerating Fosamax well.  Continue.  Will monitor her calcium and vitamin D levels periodically.  Plan on repeat bone density scan in 2 years to assess treatment efficacy.  4.  moyamoya family history Recent MRA revealing stable findings compared to MRI/MRA from 2017.  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  Patient wants to follow-up with her specialist at [facility].  Number given to [facility] imaging so patient can obtain films to send to her specialist.  Will fax over records to specialist as well.  Repeat fasting lipids today to further assess.  Baby aspirin to be continued.  This visit occurred during the SARS-CoV-2 public health emergency.  Safety protocols were in place, including screening questions prior to the visit, additional usage of staff PPE, and extensive cleaning of exam room while observing appropriate contact time as indicated for disinfecting solutions.     Leeanne Rio, PA-C

## 2019-09-27 ENCOUNTER — Encounter: Payer: Self-pay | Admitting: Physician Assistant

## 2019-09-27 DIAGNOSIS — E785 Hyperlipidemia, unspecified: Secondary | ICD-10-CM

## 2019-09-28 DIAGNOSIS — E785 Hyperlipidemia, unspecified: Secondary | ICD-10-CM | POA: Insufficient documentation

## 2019-10-25 ENCOUNTER — Ambulatory Visit (INDEPENDENT_AMBULATORY_CARE_PROVIDER_SITE_OTHER): Payer: 59

## 2019-10-25 ENCOUNTER — Other Ambulatory Visit: Payer: Self-pay

## 2019-10-25 DIAGNOSIS — E785 Hyperlipidemia, unspecified: Secondary | ICD-10-CM

## 2019-10-25 LAB — LIPID PANEL
Cholesterol: 161 mg/dL (ref 0–200)
HDL: 79.1 mg/dL (ref >39.00)
LDL Cholesterol: 72 mg/dL (ref 0–99)
NonHDL: 82.36
Total CHOL/HDL Ratio: 2
Triglycerides: 50 mg/dL (ref 0.0–149.0)
VLDL: 10 mg/dL (ref 0.0–40.0)

## 2019-10-25 LAB — HEPATIC FUNCTION PANEL
ALT: 13 U/L (ref 0–35)
AST: 18 U/L (ref 0–37)
Albumin: 4.7 g/dL (ref 3.5–5.2)
Alkaline Phosphatase: 70 U/L (ref 39–117)
Bilirubin, Direct: 0.1 mg/dL (ref 0.0–0.3)
Total Bilirubin: 0.6 mg/dL (ref 0.2–1.2)
Total Protein: 7.1 g/dL (ref 6.0–8.3)

## 2019-11-17 ENCOUNTER — Other Ambulatory Visit: Payer: Self-pay | Admitting: Physician Assistant

## 2019-12-07 ENCOUNTER — Encounter: Payer: Self-pay | Admitting: Orthopedic Surgery

## 2019-12-07 ENCOUNTER — Ambulatory Visit: Payer: Self-pay

## 2019-12-07 ENCOUNTER — Ambulatory Visit (INDEPENDENT_AMBULATORY_CARE_PROVIDER_SITE_OTHER): Payer: 59 | Admitting: Orthopedic Surgery

## 2019-12-07 DIAGNOSIS — M25562 Pain in left knee: Secondary | ICD-10-CM | POA: Diagnosis not present

## 2019-12-07 NOTE — Progress Notes (Signed)
Office Visit Note   Patient: Katelyn Velazquez           Date of Birth: 1965/09/10           MRN: [id number] Visit Date: 12/07/2019 Requested by: Brun[place] Jeans, PA-C [address] [place],  [place] [address] PCP: Delorse Limber  Subjective: Chief Complaint  Patient presents with  . Knee Pain    HPI: Patient presents for evaluation of left knee pain.  She has had prior arthroscopy x2 done at another location in town.  Last procedure was for "scar tissue" done July 2019.  Those notes were not available for review.  Radiographs were done but they are not available today.  She describes primarily decreased walking endurance which is less than half a mile.  She has used THC rubs as well as over-the-counter medication without much relief.  Had an injection in June at a different facility which was not helpful.  Reports global pain in the knee.  Denies any mechanical symptoms.  She works at a computer all day.              ROS: All systems reviewed are negative as they relate to the chief complaint within the history of present illness.  Patient denies  fevers or chills.   Assessment & Plan: Visit Diagnoses:  1. Left knee pain, unspecified chronicity     Plan: Impression is left knee pain unclear etiology with no effusion good range of motion and stable collateral cruciate ligaments at this time.  She could have chondral defect which is occult or potentially some other meniscal pathology.  It is unclear at this time what the source of her pain is.  Plan MRI scan to assess before any other intervention performed.  Follow-up after that study  Follow-Up Instructions: Return for after MRI.   Orders:  Orders Placed This Encounter  Procedures  . XR KNEE 3 VIEW LEFT  . MR Knee Left w/o contrast   No orders of the defined types were placed in this encounter.     Procedures: No procedures performed   Clinical Data: No additional findings.  Objective: Vital Signs: There  were no vitals taken for this visit.  Physical Exam:   Constitutional: Patient appears well-developed HEENT:  Head: Normocephalic Eyes:EOM are normal Neck: Normal range of motion Cardiovascular: Normal rate Pulmonary/chest: Effort normal Neurologic: Patient is alert Skin: Skin is warm Psychiatric: Patient has normal mood and affect    Ortho Exam: Ortho exam demonstrates full active and passive range of motion of both knees.  She does have a little bit of fullness around the anterior lateral portal site on the left which is examined with ultrasound and does not show any discrete cystic structure or masses.  No effusion in the left knee.  Collateral crucial ligaments are stable.  Extensor mechanism is intact and nontender.  No groin pain with internal/external rotation of the leg.  Specialty Comments:  No specialty comments available.  Imaging: XR KNEE 3 VIEW LEFT  Result Date: 12/07/2019 AP lateral merchant left knee reviewed.  Alignment normal.  No acute fracture.  No significant osteoarthritis in the medial lateral or patellofemoral compartment.    PMFS History: Patient Active Problem List   Diagnosis Date Noted  . Hyperlipidemia 09/28/2019  . Osteoporosis without current pathological fracture 08/23/2019  . Primary osteoarthritis involving multiple joints 08/23/2019  . Visit for preventive health examination 08/23/2019  .  moyamoya family history 01/31/2016  .  Idiopathic scoliosis 07/01/2013   Past Medical History:  Diagnosis Date  . Allergy   . Arthritis   . Family history of Moyamoya   . Hyperlipidemia     Family History  Problem Relation Age of Onset  . Hypertension Mother   . Parkinsonism Mother   . Arthritis Mother   . Cancer Father        prostate  . Stroke Sister 51       brain anersym   . Early death Sister   . Cancer Brother        prostate  . Alcohol abuse Brother   . Depression Brother   . Hyperlipidemia Brother   . Colon cancer Neg Hx   .  Esophageal cancer Neg Hx   . Rectal cancer Neg Hx   . Stomach cancer Neg Hx     Past Surgical History:  Procedure Laterality Date  . BREAST BIOPSY    . CESAREAN SECTION  1999  . KNEE ARTHROSCOPY  2018, 2019   Social History   Occupational History  . Occupation: [employer]  Tobacco Use  . Smoking status: Never Smoker  . Smokeless tobacco: Never Used  Vaping Use  . Vaping Use: Never used  Substance and Sexual Activity  . Alcohol use: No  . Drug use: No  . Sexual activity: Yes    Birth control/protection: Other-see comments    Comment: vasectomy

## 2019-12-14 ENCOUNTER — Telehealth: Payer: Self-pay | Admitting: Orthopedic Surgery

## 2019-12-14 NOTE — Telephone Encounter (Signed)
Tried to call patient regarding the release form she completed,recording "call cannot go through at this time"

## 2019-12-15 ENCOUNTER — Encounter: Payer: Self-pay | Admitting: Physician Assistant

## 2019-12-15 DIAGNOSIS — G8929 Other chronic pain: Secondary | ICD-10-CM

## 2019-12-21 ENCOUNTER — Telehealth: Payer: Self-pay | Admitting: Orthopedic Surgery

## 2019-12-21 NOTE — Telephone Encounter (Signed)
Called pt 1X for set MRI review appt after 10/31. Will try back later

## 2019-12-25 ENCOUNTER — Other Ambulatory Visit: Payer: Self-pay

## 2019-12-25 ENCOUNTER — Ambulatory Visit
Admission: RE | Admit: 2019-12-25 | Discharge: 2019-12-25 | Disposition: A | Discharge status: Home / Self Care | Payer: 59 | Source: Ambulatory Visit | Attending: Orthopedic Surgery | Admitting: Orthopedic Surgery

## 2019-12-25 DIAGNOSIS — M25562 Pain in left knee: Secondary | ICD-10-CM

## 2020-01-04 ENCOUNTER — Other Ambulatory Visit: Payer: Self-pay

## 2020-01-04 ENCOUNTER — Telehealth: Payer: Self-pay | Admitting: Orthopedic Surgery

## 2020-01-04 ENCOUNTER — Ambulatory Visit (INDEPENDENT_AMBULATORY_CARE_PROVIDER_SITE_OTHER): Payer: 59 | Admitting: Orthopedic Surgery

## 2020-01-04 DIAGNOSIS — M25562 Pain in left knee: Secondary | ICD-10-CM

## 2020-01-04 NOTE — Telephone Encounter (Signed)
Patient seen in clinic today for follow up visit. She was asking about the medical release form that she signed at her initial visit on 12/07/19 for [place] to obtain her records from prior orthopedic doctor that treated her. Do you know anything about this or could you help [place] with this? Thanks.

## 2020-01-05 NOTE — Telephone Encounter (Signed)
I will follow up on this. [facility],for whatever reason, is extremely slow to fulfill our requests for records.

## 2020-01-07 ENCOUNTER — Encounter: Payer: Self-pay | Admitting: Orthopedic Surgery

## 2020-01-07 NOTE — Progress Notes (Signed)
Office Visit Note   Patient: Katelyn Velazquez           Date of Birth: January 24, 1966           MRN: [id number] Visit Date: 01/04/2020 Requested by: [place] Jeans, PA-C [address] [place],  [place] [address] PCP: Delorse Limber  Subjective: Chief Complaint  Patient presents with  . scan review    HPI: Patient presents for follow-up of MRI scan.  She has tried Voltaren topically along with CBD oil topically without much relief.  Denies any low back pain or radiation.  Denies any groin pain.  States that the knee swells at the end of the day.  She had an injection in June which did not help her much.  She does desk work.  Symptoms have been worse since April 2020.  She has had 2 arthroscopies.  Last surgical note was the only information we received from the other office.  That operative report demonstrates pretty underwhelming findings at the time of her last arthroscopy.  Joint surfaces looked reasonable.  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  "Scar tissue removal" was performed.              ROS: All systems reviewed are negative as they relate to the chief complaint within the history of present illness.  Patient denies  fevers or chills.   Assessment & Plan: Visit Diagnoses:  1. Left knee pain, unspecified chronicity     Plan: Impression is unclear etiology of left knee pain.  MRI scan is reviewed with the patient today.  In general she is got some postsurgical changes in the fat pad.  Perhaps a little bit of patellofemoral wear but ligaments intact no bone bruising and menisci intact.  Impression is left knee pain unclear etiology.  I think that we could do a bone scan just to look to see if this is coming from her patellofemoral joint.  She does have worse pain going up and down stairs and just flat ground walking.  Overall the pain is debilitating.  Does not really look like examination wise or history wise this is referred pain from the  hip or back but that would have to be a consideration of further studies are normal.  For now I think left knee bone scan to evaluate for occult arthritis in that knee is indicated.  If she has normal MRI scan and normal bone scan but still has pain in the knee I think work-up of the back is indicated.  Follow-up after the bone scan.  Follow-Up Instructions: No follow-ups on file.   Orders:  Orders Placed This Encounter  Procedures  . NM Bone Scan 3 Phase Lower Extremity   No orders of the defined types were placed in this encounter.     Procedures: No procedures performed   Clinical Data: No additional findings.  Objective: Vital Signs: There were no vitals taken for this visit.  Physical Exam:   Constitutional: Patient appears well-developed HEENT:  Head: Normocephalic Eyes:EOM are normal Neck: Normal range of motion Cardiovascular: Normal rate Pulmonary/chest: Effort normal Neurologic: Patient is alert Skin: Skin is warm Psychiatric: Patient has normal mood and affect    Ortho Exam: Ortho exam demonstrates no nerve root tension signs.  Gait is pretty normal without limping.  No effusion in the left knee.  Range of motion is full.  She does have a little bit more patellofemoral crepitus on  the left than the right.  Negative patellar apprehension.  Palpable pedal pulses with good ankle dorsiflexion strength bilaterally  Specialty Comments:  No specialty comments available.  Imaging: No results found.   PMFS History: Patient Active Problem List   Diagnosis Date Noted  . Hyperlipidemia 09/28/2019  . Osteoporosis without current pathological fracture 08/23/2019  . Primary osteoarthritis involving multiple joints 08/23/2019  . Visit for preventive health examination 08/23/2019  .  moyamoya family history 01/31/2016  . Idiopathic scoliosis 07/01/2013   Past Medical History:  Diagnosis Date  . Allergy   . Arthritis   . Family history of Moyamoya   .  Hyperlipidemia     Family History  Problem Relation Age of Onset  . Hypertension Mother   . Parkinsonism Mother   . Arthritis Mother   . Cancer Father        prostate  . Stroke Sister 38       brain anersym   . Early death Sister   . Cancer Brother        prostate  . Alcohol abuse Brother   . Depression Brother   . Hyperlipidemia Brother   . Colon cancer Neg Hx   . Esophageal cancer Neg Hx   . Rectal cancer Neg Hx   . Stomach cancer Neg Hx     Past Surgical History:  Procedure Laterality Date  . BREAST BIOPSY    . CESAREAN SECTION  1999  . KNEE ARTHROSCOPY  2018, 2019   Social History   Occupational History  . Occupation: [employer]  Tobacco Use  . Smoking status: Never Smoker  . Smokeless tobacco: Never Used  Vaping Use  . Vaping Use: Never used  Substance and Sexual Activity  . Alcohol use: No  . Drug use: No  . Sexual activity: Yes    Birth control/protection: Other-see comments    Comment: vasectomy

## 2020-01-11 NOTE — Telephone Encounter (Signed)
thx

## 2020-01-12 ENCOUNTER — Telehealth: Payer: Self-pay | Admitting: Orthopedic Surgery

## 2020-01-12 NOTE — Telephone Encounter (Signed)
Called and left 1X vm for patient to set appt with Dr. Marlou Sa for Bone scan reading. Will try back another time

## 2020-01-25 ENCOUNTER — Other Ambulatory Visit: Payer: Self-pay

## 2020-01-25 ENCOUNTER — Encounter (HOSPITAL_COMMUNITY)
Admission: RE | Admit: 2020-01-25 | Discharge: 2020-01-25 | Disposition: A | Discharge status: Home / Self Care | Payer: 59 | Source: Ambulatory Visit | Attending: Orthopedic Surgery | Admitting: Orthopedic Surgery

## 2020-01-25 DIAGNOSIS — M25562 Pain in left knee: Secondary | ICD-10-CM | POA: Insufficient documentation

## 2020-01-25 MED ORDER — TECHNETIUM TC 99M MEDRONATE IV KIT
21.4000 | PACK | Freq: Once | INTRAVENOUS | Status: AC | PRN
  Administered 2020-01-25: 21.4 via INTRAVENOUS

## 2020-01-26 ENCOUNTER — Ambulatory Visit (INDEPENDENT_AMBULATORY_CARE_PROVIDER_SITE_OTHER): Payer: 59 | Admitting: Orthopedic Surgery

## 2020-01-26 ENCOUNTER — Ambulatory Visit (INDEPENDENT_AMBULATORY_CARE_PROVIDER_SITE_OTHER): Payer: 59

## 2020-01-26 ENCOUNTER — Encounter: Payer: Self-pay | Admitting: Orthopedic Surgery

## 2020-01-26 DIAGNOSIS — M79605 Pain in left leg: Secondary | ICD-10-CM | POA: Diagnosis not present

## 2020-01-29 ENCOUNTER — Encounter: Payer: Self-pay | Admitting: Orthopedic Surgery

## 2020-01-29 NOTE — Progress Notes (Signed)
Office Visit Note   Patient: Katelyn Velazquez           Date of Birth: 1965/06/13           MRN: [id number] Visit Date: 01/26/2020 Requested by: Brunetta Jeans, PA-C [id number] [place],  [place] [address] PCP: Delorse Limber  Subjective: Chief Complaint  Patient presents with  . Left Knee - Pain    HPI: Patient presents for follow-up of bone scan.  She has left knee pain as well as 2 prior surgeries.  She is having at times radicular component of the pain but for the most part it remains around the knee.  Bone scan is normal.  MRI scan was essentially normal except for some postoperative scarring in the left knee.  Reports radiating pain at times in the anterior thigh and knee as well as some pain which occasionally radiates down the leg.  Not too much in the way of discrete groin pain or discrete low back pain although she does have a history of scoliosis.              ROS: All systems reviewed are negative as they relate to the chief complaint within the history of present illness.  Patient denies  fevers or chills.   Assessment & Plan: Visit Diagnoses:  1. Pain in left leg     Plan: Impression is left "knee pain" which does not appear to have a definitive operatively correctable pathology within the knee joint.  Based on the amount of disability the patient is having I think is reasonable to rule out referred source of pain from both the back and the hip.  Plain radiographs of the back are pretty concerning for possible far lateral disc herniation and compression.  Hips are little bit less compelling.  Nonetheless I think with failure of injection treatment and relatively normal MRI scan of the knee and normal bone scan of the knee referred source of pain should be ruled out.  Patient has recently seen a physician at [facility] who agrees with the assessment that no operative pathology is present in the knee.  I will see her back after her studies.  Follow-Up Instructions:  Return for after MRI.   Orders:  Orders Placed This Encounter  Procedures  . XR Lumbar Spine 2-3 Views  . XR HIPS BILAT W OR W/O PELVIS MIN 5 VIEWS  . MR Lumbar Spine w/o contrast  . MR Pelvis w/o contrast   No orders of the defined types were placed in this encounter.     Procedures: No procedures performed   Clinical Data: No additional findings.  Objective: Vital Signs: There were no vitals taken for this visit.  Physical Exam:   Constitutional: Patient appears well-developed HEENT:  Head: Normocephalic Eyes:EOM are normal Neck: Normal range of motion Cardiovascular: Normal rate Pulmonary/chest: Effort normal Neurologic: Patient is alert Skin: Skin is warm Psychiatric: Patient has normal mood and affect    Ortho Exam: Ortho exam demonstrates no left knee effusion with full range of motion.  Not much in the way of groin pain with internal extra rotation of the leg and no limitation of internal rotation on both sides.  Not much pain with forward or lateral bending.  No definite paresthesias L1 S1 bilaterally.  Pedal pulses palpable.  No antalgic gait on the left-hand side today but she has had that in the past.  Patient is very thin but does not have appreciable asymmetric  muscle atrophy left leg versus right.  Specialty Comments:  No specialty comments available.  Imaging: No results found.   PMFS History: Patient Active Problem List   Diagnosis Date Noted  . Hyperlipidemia 09/28/2019  . Osteoporosis without current pathological fracture 08/23/2019  . Primary osteoarthritis involving multiple joints 08/23/2019  . Visit for preventive health examination 08/23/2019  .  moyamoya family history 01/31/2016  . Idiopathic scoliosis 07/01/2013   Past Medical History:  Diagnosis Date  . Allergy   . Arthritis   . Family history of Moyamoya   . Hyperlipidemia     Family History  Problem Relation Age of Onset  . Hypertension Mother   . Parkinsonism Mother     . Arthritis Mother   . Cancer Father        prostate  . Stroke Sister 11       brain anersym   . Early death Sister   . Cancer Brother        prostate  . Alcohol abuse Brother   . Depression Brother   . Hyperlipidemia Brother   . Colon cancer Neg Hx   . Esophageal cancer Neg Hx   . Rectal cancer Neg Hx   . Stomach cancer Neg Hx     Past Surgical History:  Procedure Laterality Date  . BREAST BIOPSY    . CESAREAN SECTION  1999  . KNEE ARTHROSCOPY  2018, 2019   Social History   Occupational History  . Occupation: [employer]  Tobacco Use  . Smoking status: Never Smoker  . Smokeless tobacco: Never Used  Vaping Use  . Vaping Use: Never used  Substance and Sexual Activity  . Alcohol use: No  . Drug use: No  . Sexual activity: Yes    Birth control/protection: Other-see comments    Comment: vasectomy

## 2020-02-01 ENCOUNTER — Ambulatory Visit: Payer: 59 | Admitting: Physician Assistant

## 2020-02-09 ENCOUNTER — Telehealth: Payer: Self-pay | Admitting: Orthopedic Surgery

## 2020-02-09 NOTE — Telephone Encounter (Signed)
Called pt and left vm for pt to call and set MRI Review appt with Dr. Marlou Sa after 12/20.

## 2020-02-13 ENCOUNTER — Ambulatory Visit
Admission: RE | Admit: 2020-02-13 | Discharge: 2020-02-13 | Disposition: A | Discharge status: Home / Self Care | Payer: 59 | Source: Ambulatory Visit | Attending: Orthopedic Surgery | Admitting: Orthopedic Surgery

## 2020-02-13 ENCOUNTER — Other Ambulatory Visit: Payer: Self-pay

## 2020-02-13 DIAGNOSIS — M79605 Pain in left leg: Secondary | ICD-10-CM

## 2020-02-14 ENCOUNTER — Telehealth: Payer: Self-pay | Admitting: Orthopedic Surgery

## 2020-02-14 NOTE — Telephone Encounter (Signed)
Called pt 2X time for pt to call and set appt after 12/20 for MRI results with Dr. Marlou Sa. Will try again later

## 2020-02-15 ENCOUNTER — Other Ambulatory Visit: Payer: Self-pay | Admitting: Physician Assistant

## 2020-02-21 ENCOUNTER — Telehealth: Payer: Self-pay | Admitting: Orthopedic Surgery

## 2020-02-21 NOTE — Telephone Encounter (Signed)
Completing pt off my MRI Review list. Called pt 4X and left vm. No response.

## 2020-03-08 LAB — HM MAMMOGRAPHY

## 2020-03-09 ENCOUNTER — Encounter: Payer: Self-pay | Admitting: Physician Assistant

## 2020-03-27 ENCOUNTER — Encounter: Payer: Self-pay | Admitting: Orthopedic Surgery

## 2020-03-29 NOTE — Telephone Encounter (Signed)
Question #1.  Correct Question #2.  Correct.  In terms of preventing it from getting worse she may want to consider epidural steroid injections.  In her lumbar spine. Happy to review the scans with her in the office but I would agree that PRP injection and nonsurgical treatment in the knee is indicated before any more arthroscopy.

## 2020-04-23 ENCOUNTER — Other Ambulatory Visit: Payer: Self-pay

## 2020-04-23 ENCOUNTER — Ambulatory Visit: Payer: 59

## 2020-04-23 DIAGNOSIS — E785 Hyperlipidemia, unspecified: Secondary | ICD-10-CM

## 2020-04-23 LAB — LIPID PANEL
Cholesterol: 171 mg/dL (ref 0–200)
HDL: 79.6 mg/dL (ref >39.00)
LDL Cholesterol: 83 mg/dL (ref 0–99)
NonHDL: 91.89
Total CHOL/HDL Ratio: 2
Triglycerides: 45 mg/dL (ref 0.0–149.0)
VLDL: 9 mg/dL (ref 0.0–40.0)

## 2020-05-09 ENCOUNTER — Encounter: Payer: Self-pay | Admitting: Family Medicine

## 2020-05-10 NOTE — Telephone Encounter (Signed)
Appointment cancelled

## 2020-05-16 ENCOUNTER — Other Ambulatory Visit: Payer: Self-pay | Admitting: Physician Assistant

## 2020-05-21 NOTE — Telephone Encounter (Signed)
Pt called in stating that she is needing a refill on the Atorvastatin.  Pt uses [facility] in [place]    Please advise

## 2020-06-04 ENCOUNTER — Encounter: Payer: 59 | Admitting: Family Medicine

## 2020-06-07 ENCOUNTER — Encounter: Payer: Self-pay | Admitting: Registered Nurse

## 2020-06-07 ENCOUNTER — Other Ambulatory Visit: Payer: Self-pay

## 2020-06-07 ENCOUNTER — Ambulatory Visit (INDEPENDENT_AMBULATORY_CARE_PROVIDER_SITE_OTHER): Payer: 59 | Admitting: Registered Nurse

## 2020-06-07 VITALS — BP 122/68 | HR 66 | Temp 98.0°F | Resp 15 | Ht 61.0 in | Wt 99.4 lb

## 2020-06-07 DIAGNOSIS — Z130 Encounter for screening for diseases of the blood and blood-forming organs and certain disorders involving the immune mechanism: Secondary | ICD-10-CM | POA: Diagnosis not present

## 2020-06-07 DIAGNOSIS — Z13228 Encounter for screening for other metabolic disorders: Secondary | ICD-10-CM

## 2020-06-07 DIAGNOSIS — Z1329 Encounter for screening for other suspected endocrine disorder: Secondary | ICD-10-CM | POA: Diagnosis not present

## 2020-06-07 DIAGNOSIS — M542 Cervicalgia: Secondary | ICD-10-CM

## 2020-06-07 DIAGNOSIS — E785 Hyperlipidemia, unspecified: Secondary | ICD-10-CM | POA: Diagnosis not present

## 2020-06-07 DIAGNOSIS — G8929 Other chronic pain: Secondary | ICD-10-CM

## 2020-06-07 DIAGNOSIS — Z7689 Persons encountering health services in other specified circumstances: Secondary | ICD-10-CM | POA: Diagnosis not present

## 2020-06-07 LAB — COMPREHENSIVE METABOLIC PANEL
ALT: 15 U/L (ref 0–35)
AST: 20 U/L (ref 0–37)
Albumin: 4.2 g/dL (ref 3.5–5.2)
Alkaline Phosphatase: 36 U/L — ABNORMAL LOW (ref 39–117)
BUN: 16 mg/dL (ref 6–23)
CO2: 26 mEq/L (ref 19–32)
Calcium: 9 mg/dL (ref 8.4–10.5)
Chloride: 103 mEq/L (ref 96–112)
Creatinine, Ser: 1.02 mg/dL (ref 0.40–1.20)
GFR: 61.49 mL/min (ref >60.00)
Glucose, Bld: 61 mg/dL — ABNORMAL LOW (ref 70–99)
Potassium: 3.8 mEq/L (ref 3.5–5.1)
Sodium: 138 mEq/L (ref 135–145)
Total Bilirubin: 0.4 mg/dL (ref 0.2–1.2)
Total Protein: 7 g/dL (ref 6.0–8.3)

## 2020-06-07 LAB — CBC WITH DIFFERENTIAL/PLATELET
Basophils Absolute: 0 10*3/uL (ref 0.0–0.1)
Basophils Relative: 1.8 % (ref 0.0–3.0)
Eosinophils Absolute: 0.1 10*3/uL (ref 0.0–0.7)
Eosinophils Relative: 5.1 % — ABNORMAL HIGH (ref 0.0–5.0)
HCT: 35.3 % — ABNORMAL LOW (ref 36.0–46.0)
Hemoglobin: 12.3 g/dL (ref 12.0–15.0)
Lymphocytes Relative: 39.4 % (ref 12.0–46.0)
Lymphs Abs: 1 10*3/uL (ref 0.7–4.0)
MCHC: 34.9 g/dL (ref 30.0–36.0)
MCV: 98 fl (ref 78.0–100.0)
Monocytes Absolute: 0.3 10*3/uL (ref 0.1–1.0)
Monocytes Relative: 10.5 % (ref 3.0–12.0)
Neutro Abs: 1.1 10*3/uL — ABNORMAL LOW (ref 1.4–7.7)
Neutrophils Relative %: 43.2 % (ref 43.0–77.0)
Platelets: 178 10*3/uL (ref 150.0–400.0)
RBC: 3.61 Mil/uL — ABNORMAL LOW (ref 3.87–5.11)
RDW: 13 % (ref 11.5–15.5)
WBC: 2.6 10*3/uL — ABNORMAL LOW (ref 4.0–10.5)

## 2020-06-07 LAB — TSH: TSH: 3.35 u[IU]/mL (ref 0.35–4.50)

## 2020-06-07 LAB — HEMOGLOBIN A1C: Hgb A1c MFr Bld: 5.7 % (ref 4.6–6.5)

## 2020-06-11 ENCOUNTER — Encounter: Payer: Self-pay | Admitting: Registered Nurse

## 2020-06-11 NOTE — Progress Notes (Signed)
Established Patient Office Visit  Subjective:  Patient ID: Katelyn Velazquez, female    DOB: 04/19/1965  Age: 55 y.o. MRN: [id number]  CC:  Chief Complaint  Patient presents with  . Establish Care    Pt here to establish care, pt would like a liver function test due to some of her medications, and would also like referral for some help with arthritis in her neck and shoulders     HPI Katelyn Velazquez presents for visit to est care  Histories reviewed, updated as warranted  Concerns today:  Liver function: Wants to ensure this is not being impacted by her medications, in particular her statin. No symptoms of dysfunction or distinct symptoms. Has been on a statin for some time. No hx of intolerance.  Neck pain Ongoing, bilateral. Radiates towards shoulders. Numbness and radicular symptoms in BUE. Worse at night, has to be careful with positioning. Symptoms relieved when shoulders and neck are in neutral position. Would like to discuss next steps.  Past Medical History:  Diagnosis Date  . Allergy   . Arthritis   . Family history of Moyamoya   . Hyperlipidemia     Past Surgical History:  Procedure Laterality Date  . BREAST BIOPSY    . CESAREAN SECTION  1999  . KNEE ARTHROSCOPY  2018, 2019    Family History  Problem Relation Age of Onset  . Hypertension Mother   . Parkinsonism Mother   . Arthritis Mother   . Cancer Father        prostate  . Stroke Sister 38       brain anersym   . Early death Sister   . Cancer Brother        prostate  . Alcohol abuse Brother   . Depression Brother   . Hyperlipidemia Brother   . Prostate cancer Brother   . Colon cancer Neg Hx   . Esophageal cancer Neg Hx   . Rectal cancer Neg Hx   . Stomach cancer Neg Hx     Social History   Socioeconomic History  . Marital status: Married    Spouse name: Not on file  . Number of children: 1  . Years of education: 78  . Highest education level: Not on file  Occupational History  .  Occupation: [employer]  Tobacco Use  . Smoking status: Never Smoker  . Smokeless tobacco: Never Used  Vaping Use  . Vaping Use: Never used  Substance and Sexual Activity  . Alcohol use: No  . Drug use: No  . Sexual activity: Yes    Birth control/protection: Other-see comments    Comment: vasectomy  Other Topics Concern  . Not on file  Social History Narrative   Lives at home w/ her husband   Right-handed   Caffeine: 2-4 cups of coffee per day   Social Determinants of Health   Financial Resource Strain: Not on file  Food Insecurity: Not on file  Transportation Needs: Not on file  Physical Activity: Not on file  Stress: Not on file  Social Connections: Not on file  Intimate Partner Violence: Not on file    Outpatient Medications Prior to Visit  Medication Sig Dispense Refill  . alendronate (FOSAMAX) 70 MG tablet Take 1 tablet (70 mg total) by mouth every 7 (seven) days. Take with a full glass of water on an empty stomach. 4 tablet 11  . atorvastatin (LIPITOR) 10 MG tablet TAKE 1 TABLET BY MOUTH EVERY DAY 30 tablet 2  .  calcium-vitamin D (OSCAL WITH D) 500-200 MG-UNIT tablet Take 2 tablets by mouth.    . diclofenac Sodium (VOLTAREN) 1 % GEL Apply topically 4 (four) times daily.    Marland Kitchen loratadine (CLARITIN) 10 MG tablet Take 10 mg by mouth daily.    . meloxicam (MOBIC) 15 MG tablet Take 1 tablet (15 mg total) by mouth daily. 30 tablet 0  . Turmeric 1053 MG TABS Take 1 tablet by mouth daily.     No facility-administered medications prior to visit.    No Known Allergies  ROS Review of Systems  Constitutional: Negative.   HENT: Negative.   Eyes: Negative.   Respiratory: Negative.   Cardiovascular: Negative.   Gastrointestinal: Negative.   Genitourinary: Negative.   Musculoskeletal: Negative.   Skin: Negative.   Neurological: Negative.   Psychiatric/Behavioral: Negative.   All other systems reviewed and are negative.     Objective:    Physical Exam Vitals and  nursing note reviewed.  Constitutional:      General: She is not in acute distress.    Appearance: Normal appearance. She is normal weight. She is not ill-appearing, toxic-appearing or diaphoretic.  Cardiovascular:     Rate and Rhythm: Normal rate and regular rhythm.     Heart sounds: Normal heart sounds. No murmur heard. No friction rub. No gallop.   Pulmonary:     Effort: Pulmonary effort is normal. No respiratory distress.     Breath sounds: Normal breath sounds. No stridor. No wheezing, rhonchi or rales.  Chest:     Chest wall: No tenderness.  Musculoskeletal:        General: No swelling, tenderness, deformity or signs of injury. Normal range of motion.  Skin:    General: Skin is warm and dry.  Neurological:     General: No focal deficit present.     Mental Status: She is alert and oriented to person, place, and time. Mental status is at baseline.  Psychiatric:        Mood and Affect: Mood normal.        Behavior: Behavior normal.        Thought Content: Thought content normal.        Judgment: Judgment normal.     BP 122/68   Pulse 66   Temp 98 F (36.7 C) (Temporal)   Resp 15   Ht 5\' 1"  (1.549 m)   Wt 99 lb 6.4 oz (45.1 kg)   SpO2 98%   BMI 18.78 kg/m  Wt Readings from Last 3 Encounters:  06/07/20 99 lb 6.4 oz (45.1 kg)  08/23/19 94 lb 3.2 oz (42.7 kg)  07/26/19 96 lb (43.5 kg)     There are no preventive care reminders to display for this patient.  There are no preventive care reminders to display for this patient.  Lab Results  Component Value Date   TSH 3.35 06/07/2020   Lab Results  Component Value Date   WBC 2.6 (L) 06/07/2020   HGB 12.3 06/07/2020   HCT 35.3 (L) 06/07/2020   MCV 98.0 06/07/2020   PLT 178.0 06/07/2020   Lab Results  Component Value Date   NA 138 06/07/2020   K 3.8 06/07/2020   CO2 26 06/07/2020   GLUCOSE 61 (L) 06/07/2020   BUN 16 06/07/2020   CREATININE 1.02 06/07/2020   BILITOT 0.4 06/07/2020   ALKPHOS 36 (L)  06/07/2020   AST 20 06/07/2020   ALT 15 06/07/2020   PROT 7.0 06/07/2020   ALBUMIN 4.2  06/07/2020   CALCIUM 9.0 06/07/2020   GFR 61.49 06/07/2020   Lab Results  Component Value Date   CHOL 171 04/23/2020   Lab Results  Component Value Date   HDL 79.60 04/23/2020   Lab Results  Component Value Date   LDLCALC 83 04/23/2020   Lab Results  Component Value Date   TRIG 45.0 04/23/2020   Lab Results  Component Value Date   CHOLHDL 2 04/23/2020   Lab Results  Component Value Date   HGBA1C 5.7 06/07/2020      Assessment & Plan:   Problem List Items Addressed This Visit      Other   Hyperlipidemia - Primary    Other Visit Diagnoses    Screening for endocrine, metabolic and immunity disorder       Relevant Orders   Comprehensive metabolic panel (Completed)   TSH (Completed)   Hemoglobin A1c (Completed)   CBC with Differential/Platelet (Completed)   Chronic neck pain       Relevant Orders   DG Cervical Spine Complete   Encounter to establish care          No orders of the defined types were placed in this encounter.   Follow-up: No follow-ups on file.   PLAN  Discussed safety and efficacy of medications. Will check labs today.  Order dg c spine. Suspect OA or degenerative changes. Will follow up based on results. Suggest stretching and OTC analgesics as needed.  Return q6 mo for hld  Patient encouraged to call clinic with any questions, comments, or concerns.  Maximiano Coss, NP

## 2020-06-26 ENCOUNTER — Encounter: Payer: Self-pay | Admitting: Nurse Practitioner

## 2020-06-26 ENCOUNTER — Encounter: Payer: Self-pay | Admitting: Registered Nurse

## 2020-06-26 NOTE — Telephone Encounter (Signed)
Patient would like to schedule an office visit.

## 2020-07-18 ENCOUNTER — Other Ambulatory Visit: Payer: Self-pay

## 2020-07-18 ENCOUNTER — Telehealth: Payer: Self-pay | Admitting: Registered Nurse

## 2020-07-18 MED ORDER — ALENDRONATE SODIUM 70 MG PO TABS: 70.0000 mg | ORAL_TABLET | ORAL | 6 refills | Status: DC

## 2020-07-18 MED ORDER — MELOXICAM 15 MG PO TABS
15.0000 mg | ORAL_TABLET | Freq: Every day | ORAL | 0 refills | Status: DC
Start: 2020-07-18 — End: 2020-08-15

## 2020-07-18 NOTE — Telephone Encounter (Signed)
Pt called in asking for a refill on the Fosamax and Meloxicam   [facility] [place]

## 2020-07-18 NOTE — Telephone Encounter (Signed)
LFD 08/23/19 #30 with no refills LOV 06/07/20 NOV 12/07/20

## 2020-07-18 NOTE — Telephone Encounter (Signed)
LFD for meloxicam 08/23/19 #30 with no refills LOV

## 2020-07-18 NOTE — Telephone Encounter (Signed)
Sent to pcp for approval. Fosamax sent to pharmacy.

## 2020-08-02 ENCOUNTER — Other Ambulatory Visit: Payer: Self-pay

## 2020-08-02 ENCOUNTER — Ambulatory Visit (HOSPITAL_BASED_OUTPATIENT_CLINIC_OR_DEPARTMENT_OTHER)
Admission: RE | Admit: 2020-08-02 | Discharge: 2020-08-02 | Disposition: A | Discharge status: Home / Self Care | Payer: 59 | Source: Ambulatory Visit | Attending: Registered Nurse | Admitting: Registered Nurse

## 2020-08-02 DIAGNOSIS — G8929 Other chronic pain: Secondary | ICD-10-CM | POA: Insufficient documentation

## 2020-08-02 DIAGNOSIS — M542 Cervicalgia: Secondary | ICD-10-CM | POA: Diagnosis present

## 2020-08-06 ENCOUNTER — Encounter: Payer: Self-pay | Admitting: Nurse Practitioner

## 2020-08-06 ENCOUNTER — Other Ambulatory Visit: Payer: Self-pay

## 2020-08-06 ENCOUNTER — Ambulatory Visit (INDEPENDENT_AMBULATORY_CARE_PROVIDER_SITE_OTHER): Payer: 59 | Admitting: Nurse Practitioner

## 2020-08-06 VITALS — BP 114/70 | Ht 61.0 in | Wt 97.0 lb

## 2020-08-06 DIAGNOSIS — M810 Age-related osteoporosis without current pathological fracture: Secondary | ICD-10-CM | POA: Diagnosis not present

## 2020-08-06 DIAGNOSIS — Z01419 Encounter for gynecological examination (general) (routine) without abnormal findings: Secondary | ICD-10-CM

## 2020-08-06 DIAGNOSIS — Z780 Asymptomatic menopausal state: Secondary | ICD-10-CM

## 2020-08-06 NOTE — Progress Notes (Signed)
   Jasiyah Tu-Chalmers Jul 15, 1965 [id number]   History:  55 y.o. G1P1 presents for annual exam without GYN complaints. Postmenopausal - no HRT, no bleeding. Husband has vasectomy. Normal pap and mammogram history. 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   Gynecologic History No LMP recorded. Patient is postmenopausal.   Contraception/Family planning: post menopausal status and vasectomy  Health Maintenance Last Pap: 07/26/2019. Results were: Normal Last mammogram: 03/08/2020. Results were: Normal Last colonoscopy: 2016. Results were: benign polyp, 10-year recall Last Dexa: 08/03/2018. Results were: T-score -3.1 of spine  Past medical history, past surgical history, family history and social history were all reviewed and documented in the EPIC chart. Married. Works as Marine scientist. 81 yo daughter recently moved to [place] for work.   ROS:  A ROS was performed and pertinent positives and negatives are included.  Exam:  Vitals:   08/06/20 1400  BP: 114/70  Weight: 97 lb (44 kg)  Height: 5\' 1"  (1.549 m)   Body mass index is 18.33 kg/m.  General appearance:  Normal Thyroid:  Symmetrical, normal in size, without palpable masses or nodularity. Respiratory  Auscultation:  Clear without wheezing or rhonchi Cardiovascular  Auscultation:  Regular rate, without rubs, murmurs or gallops  Edema/varicosities:  Not grossly evident Abdominal  Soft,nontender, without masses, guarding or rebound.  Liver/spleen:  No organomegaly noted  Hernia:  None appreciated  Skin  Inspection:  Grossly normal Breasts: Examined lying and sitting.   Right: Without masses, retractions, nipple discharge or axillary adenopathy.   Left: Without masses, retractions, nipple discharge or axillary adenopathy. Genitourinary   Inguinal/mons:  Normal without inguinal adenopathy  External genitalia:  Normal appearing vulva with no masses, tenderness, or lesions  BUS/Urethra/Skene's glands:   Normal  Vagina:  Normal appearing with normal color and discharge, no lesions  Cervix:  Normal appearing without discharge or lesions  Uterus:  Normal in size, shape and contour.  Midline and mobile, nontender  Adnexa/parametria:     Rt: Normal in size, without masses or tenderness.   Lt: Normal in size, without masses or tenderness.  Anus and perineum: Normal  Digital rectal exam: Normal sphincter tone without palpated masses or tenderness  Assessment/Plan:  55 y.o. G1P1 for annual exam.   Well female exam with routine gynecological exam - Education provided on SBEs, importance of preventative screenings, current guidelines, high calcium diet, regular exercise, and multivitamin daily. Labs with PCP.   Postmenopausal - no HRT, no bleeding.   Osteoporosis without current pathological fracture, unspecified osteoporosis type. Managed by PCP, on Fosamax since 07/2019. Will repeat bone density as recommended. She thinks she had DXA a year ago but I do not have these records and most recent one I see was 07/2018. She will follow up with PCP to see when she is due.   Screening for cervical cancer - Normal Pap history.  Will repeat at 5-year interval per guidelines.  Screening for breast cancer - Normal mammogram history.  Continue annual screenings.  Normal breast exam today.  Screening for colon cancer - 2016 colonoscopy. Will repeat at GI's recommended interval.   Return in 1 year for annual.    Tamela Gammon DNP, 2:17 PM 08/06/2020

## 2020-08-15 ENCOUNTER — Other Ambulatory Visit: Payer: Self-pay | Admitting: Registered Nurse

## 2020-08-20 ENCOUNTER — Other Ambulatory Visit: Payer: Self-pay

## 2020-08-20 DIAGNOSIS — E785 Hyperlipidemia, unspecified: Secondary | ICD-10-CM

## 2020-08-20 MED ORDER — ATORVASTATIN CALCIUM 10 MG PO TABS: 10.0000 mg | ORAL_TABLET | Freq: Every day | ORAL | 2 refills | Status: DC

## 2020-09-10 ENCOUNTER — Encounter: Payer: Self-pay | Admitting: Registered Nurse

## 2020-09-13 ENCOUNTER — Other Ambulatory Visit: Payer: Self-pay | Admitting: Registered Nurse

## 2020-09-13 DIAGNOSIS — M810 Age-related osteoporosis without current pathological fracture: Secondary | ICD-10-CM

## 2020-09-18 ENCOUNTER — Ambulatory Visit
Admission: RE | Admit: 2020-09-18 | Discharge: 2020-09-18 | Disposition: A | Discharge status: Home / Self Care | Payer: 59 | Source: Ambulatory Visit | Attending: Registered Nurse | Admitting: Registered Nurse

## 2020-09-18 ENCOUNTER — Other Ambulatory Visit: Payer: Self-pay

## 2020-09-18 DIAGNOSIS — M810 Age-related osteoporosis without current pathological fracture: Secondary | ICD-10-CM

## 2020-11-20 ENCOUNTER — Other Ambulatory Visit: Payer: Self-pay | Admitting: Registered Nurse

## 2020-11-20 DIAGNOSIS — E785 Hyperlipidemia, unspecified: Secondary | ICD-10-CM

## 2020-11-30 ENCOUNTER — Encounter: Payer: Self-pay | Admitting: Registered Nurse

## 2020-12-07 ENCOUNTER — Encounter: Payer: Self-pay | Admitting: Registered Nurse

## 2020-12-07 ENCOUNTER — Other Ambulatory Visit: Payer: Self-pay

## 2020-12-07 ENCOUNTER — Ambulatory Visit (INDEPENDENT_AMBULATORY_CARE_PROVIDER_SITE_OTHER): Payer: 59 | Admitting: Registered Nurse

## 2020-12-07 VITALS — BP 100/76 | HR 70 | Temp 97.8°F | Resp 16 | Ht 61.0 in | Wt 97.4 lb

## 2020-12-07 DIAGNOSIS — M810 Age-related osteoporosis without current pathological fracture: Secondary | ICD-10-CM

## 2020-12-07 DIAGNOSIS — Z79899 Other long term (current) drug therapy: Secondary | ICD-10-CM | POA: Diagnosis not present

## 2020-12-07 DIAGNOSIS — Z23 Encounter for immunization: Secondary | ICD-10-CM | POA: Diagnosis not present

## 2020-12-07 DIAGNOSIS — E785 Hyperlipidemia, unspecified: Secondary | ICD-10-CM

## 2020-12-07 LAB — COMPREHENSIVE METABOLIC PANEL
ALT: 11 U/L (ref 0–35)
AST: 16 U/L (ref 0–37)
Albumin: 4.6 g/dL (ref 3.5–5.2)
Alkaline Phosphatase: 38 U/L — ABNORMAL LOW (ref 39–117)
BUN: 14 mg/dL (ref 6–23)
CO2: 28 mEq/L (ref 19–32)
Calcium: 9.1 mg/dL (ref 8.4–10.5)
Chloride: 99 mEq/L (ref 96–112)
Creatinine, Ser: 0.8 mg/dL (ref 0.40–1.20)
GFR: 82.02 mL/min (ref >60.00)
Glucose, Bld: 76 mg/dL (ref 70–99)
Potassium: 3.7 mEq/L (ref 3.5–5.1)
Sodium: 134 mEq/L — ABNORMAL LOW (ref 135–145)
Total Bilirubin: 0.7 mg/dL (ref 0.2–1.2)
Total Protein: 6.9 g/dL (ref 6.0–8.3)

## 2020-12-07 LAB — CBC WITH DIFFERENTIAL/PLATELET
Basophils Absolute: 0.1 10*3/uL (ref 0.0–0.1)
Basophils Relative: 1.7 % (ref 0.0–3.0)
Eosinophils Absolute: 0.1 10*3/uL (ref 0.0–0.7)
Eosinophils Relative: 4.4 % (ref 0.0–5.0)
HCT: 36.7 % (ref 36.0–46.0)
Hemoglobin: 12.2 g/dL (ref 12.0–15.0)
Lymphocytes Relative: 32.3 % (ref 12.0–46.0)
Lymphs Abs: 1 10*3/uL (ref 0.7–4.0)
MCHC: 33.3 g/dL (ref 30.0–36.0)
MCV: 99.4 fl (ref 78.0–100.0)
Monocytes Absolute: 0.3 10*3/uL (ref 0.1–1.0)
Monocytes Relative: 9 % (ref 3.0–12.0)
Neutro Abs: 1.6 10*3/uL (ref 1.4–7.7)
Neutrophils Relative %: 52.6 % (ref 43.0–77.0)
Platelets: 201 10*3/uL (ref 150.0–400.0)
RBC: 3.7 Mil/uL — ABNORMAL LOW (ref 3.87–5.11)
RDW: 12.9 % (ref 11.5–15.5)
WBC: 2.9 10*3/uL — ABNORMAL LOW (ref 4.0–10.5)

## 2020-12-07 LAB — LIPID PANEL
Cholesterol: 192 mg/dL (ref 0–200)
HDL: 82.3 mg/dL (ref >39.00)
LDL Cholesterol: 101 mg/dL — ABNORMAL HIGH (ref 0–99)
NonHDL: 109.64
Total CHOL/HDL Ratio: 2
Triglycerides: 44 mg/dL (ref 0.0–149.0)
VLDL: 8.8 mg/dL (ref 0.0–40.0)

## 2020-12-07 LAB — VITAMIN D 25 HYDROXY (VIT D DEFICIENCY, FRACTURES): VITD: 42.37 ng/mL (ref 30.00–100.00)

## 2020-12-07 MED ORDER — ALENDRONATE SODIUM 70 MG PO TABS: 70.0000 mg | ORAL_TABLET | ORAL | 4 refills | Status: DC

## 2020-12-07 MED ORDER — ATORVASTATIN CALCIUM 10 MG PO TABS: 10.0000 mg | ORAL_TABLET | Freq: Every day | ORAL | 3 refills | Status: DC

## 2020-12-07 NOTE — Patient Instructions (Signed)
[name] -  Doristine Devoid to see you! Glad things are stable.  Shingles shot #1 given today - return in 2 to 6 mo for the second shot  Lab results will be back this afternoon. I'll call with any urgent concerns.  See you in a year if labs are good!   Thank you    Rich

## 2020-12-07 NOTE — Progress Notes (Signed)
Established Patient Office Visit  Subjective:  Patient ID: Katelyn Velazquez, female    DOB: Nov 30, 1965  Age: 55 y.o. MRN: [id number]  CC:  Chief Complaint  Patient presents with   Follow-up    Patient states she is here for 6 month follow up.she has no other concerns    HPI Katelyn Velazquez presents for 6 mo follow up hld, hx of abnormal lft  No acute concerns  hld Taking atorvastatin 10mg  po qd. Good effect, no AE. Lab Results  Component Value Date   CHOL 171 04/23/2020   HDL 79.60 04/23/2020   LDLCALC 83 04/23/2020   TRIG 45.0 04/23/2020   CHOLHDL 2 04/23/2020    Low Bone Density On fosamax 70mg  po qd Initial rx given  Good effect, no AE Last Dexa showed osteoporosis Due to repeat in 08/2022.  Immunizations Interested in shingles vaccine Has not had Unsure if chicken pox as child Past Medical History:  Diagnosis Date   Allergy    Arthritis    Family history of M[place]    Hyperlipidemia     Past Surgical History:  Procedure Laterality Date   BREAST BIOPSY     CESAREAN SECTION  1999   KNEE ARTHROSCOPY  2018, 2019    Family History  Problem Relation Age of Onset   Hypertension Mother    Parkinsonism Mother    Arthritis Mother    Cancer Father        prostate   Stroke Sister 56       brain anersym    Early death Sister    Cancer Brother        prostate   Alcohol abuse Brother    Depression Brother    Hyperlipidemia Brother    Prostate cancer Brother    Colon cancer Neg Hx    Esophageal cancer Neg Hx    Rectal cancer Neg Hx    Stomach cancer Neg Hx     Social History   Socioeconomic History   Marital status: Married    Spouse name: Not on file   Number of children: 1   Years of education: 16   Highest education level: Not on file  Occupational History   Occupation: [employer]  Tobacco Use   Smoking status: Never   Smokeless tobacco: Never  Vaping Use   Vaping Use: Never used  Substance and Sexual Activity   Alcohol use: No    Drug use: No   Sexual activity: Yes    Birth control/protection: Other-see comments    Comment: vasectomy  Other Topics Concern   Not on file  Social History Narrative   Lives at home w/ her husband   Right-handed   Caffeine: 2-4 cups of coffee per day   Social Determinants of Health   Financial Resource Strain: Not on file  Food Insecurity: Not on file  Transportation Needs: Not on file  Physical Activity: Not on file  Stress: Not on file  Social Connections: Not on file  Intimate Partner Violence: Not on file    Outpatient Medications Prior to Visit  Medication Sig Dispense Refill   calcium-vitamin D (OSCAL WITH D) 500-200 MG-UNIT tablet Take 2 tablets by mouth.     diclofenac Sodium (VOLTAREN) 1 % GEL Apply topically 4 (four) times daily.     loratadine (CLARITIN) 10 MG tablet Take 10 mg by mouth daily.     meloxicam (MOBIC) 15 MG tablet TAKE 1 TABLET (15 MG TOTAL) BY MOUTH DAILY. 30 tablet 0  Omega-3 Fatty Acids (FISH OIL PO) Take by mouth.     alendronate (FOSAMAX) 70 MG tablet Take 1 tablet (70 mg total) by mouth every 7 (seven) days. Take with a full glass of water on an empty stomach. 4 tablet 6   atorvastatin (LIPITOR) 10 MG tablet TAKE 1 TABLET BY MOUTH EVERY DAY 30 tablet 2   Turmeric 1053 MG TABS Take 1 tablet by mouth daily.     No facility-administered medications prior to visit.    No Known Allergies  ROS Review of Systems  Constitutional: Negative.   HENT: Negative.    Eyes: Negative.   Respiratory: Negative.    Cardiovascular: Negative.   Gastrointestinal: Negative.   Genitourinary: Negative.   Musculoskeletal: Negative.   Skin: Negative.   Neurological: Negative.   Psychiatric/Behavioral: Negative.    All other systems reviewed and are negative.    Objective:    Physical Exam Vitals and nursing note reviewed.  Constitutional:      General: She is not in acute distress.    Appearance: Normal appearance. She is normal weight. She is not  ill-appearing, toxic-appearing or diaphoretic.  Cardiovascular:     Rate and Rhythm: Normal rate and regular rhythm.     Heart sounds: Normal heart sounds. No murmur heard.   No friction rub. No gallop.  Pulmonary:     Effort: Pulmonary effort is normal. No respiratory distress.     Breath sounds: Normal breath sounds. No stridor. No wheezing, rhonchi or rales.  Chest:     Chest wall: No tenderness.  Skin:    General: Skin is warm and dry.  Neurological:     General: No focal deficit present.     Mental Status: She is alert and oriented to person, place, and time. Mental status is at baseline.  Psychiatric:        Mood and Affect: Mood normal.        Behavior: Behavior normal.        Thought Content: Thought content normal.        Judgment: Judgment normal.    BP 100/76   Pulse 70   Temp 97.8 F (36.6 C) (Temporal)   Resp 16   Ht 5\' 1"  (1.549 m)   Wt 97 lb 6.4 oz (44.2 kg)   SpO2 99%   BMI 18.40 kg/m  Wt Readings from Last 3 Encounters:  12/07/20 97 lb 6.4 oz (44.2 kg)  08/06/20 97 lb (44 kg)  06/07/20 99 lb 6.4 oz (45.1 kg)     Health Maintenance Due  Topic Date Due   Zoster Vaccines- Shingrix (1 of 2) Never done    There are no preventive care reminders to display for this patient.  Lab Results  Component Value Date   TSH 3.35 06/07/2020   Lab Results  Component Value Date   WBC 2.6 (L) 06/07/2020   HGB 12.3 06/07/2020   HCT 35.3 (L) 06/07/2020   MCV 98.0 06/07/2020   PLT 178.0 06/07/2020   Lab Results  Component Value Date   NA 138 06/07/2020   K 3.8 06/07/2020   CO2 26 06/07/2020   GLUCOSE 61 (L) 06/07/2020   BUN 16 06/07/2020   CREATININE 1.02 06/07/2020   BILITOT 0.4 06/07/2020   ALKPHOS 36 (L) 06/07/2020   AST 20 06/07/2020   ALT 15 06/07/2020   PROT 7.0 06/07/2020   ALBUMIN 4.2 06/07/2020   CALCIUM 9.0 06/07/2020   GFR 61.49 06/07/2020   Lab Results  Component  Value Date   CHOL 171 04/23/2020   Lab Results  Component Value Date    HDL 79.60 04/23/2020   Lab Results  Component Value Date   LDLCALC 83 04/23/2020   Lab Results  Component Value Date   TRIG 45.0 04/23/2020   Lab Results  Component Value Date   CHOLHDL 2 04/23/2020   Lab Results  Component Value Date   HGBA1C 5.7 06/07/2020      Assessment & Plan:   Problem List Items Addressed This Visit       Musculoskeletal and Integument   Osteoporosis without current pathological fracture   Relevant Medications   alendronate (FOSAMAX) 70 MG tablet   Other Relevant Orders   Vitamin D (25 hydroxy)     Other   Hyperlipidemia - Primary   Relevant Medications   atorvastatin (LIPITOR) 10 MG tablet   Other Relevant Orders   CBC with Differential/Platelet   Lipid panel   Other Visit Diagnoses     On statin therapy       Relevant Orders   Comprehensive metabolic panel   CBC with Differential/Platelet   Need for shingles vaccine       Relevant Orders   Varicella-zoster vaccine IM (Shingrix)       Meds ordered this encounter  Medications   alendronate (FOSAMAX) 70 MG tablet    Sig: Take 1 tablet (70 mg total) by mouth every 7 (seven) days. Take with a full glass of water on an empty stomach.    Dispense:  12 tablet    Refill:  4    Order Specific Question:   Supervising Provider    Answer:   Carlota Raspberry, JEFFREY R [[id number]]   atorvastatin (LIPITOR) 10 MG tablet    Sig: Take 1 tablet (10 mg total) by mouth daily.    Dispense:  90 tablet    Refill:  3    Order Specific Question:   Supervising Provider    Answer:   Carlota Raspberry, JEFFREY R [[id number]]    Follow-up: Return in about 1 year (around 12/07/2021) for osteoporosis, hld.   PLAN Shingles vaccine 1 given today. Return in 2-6 mo for second shot. Refill meds x 1 year Labs collected. Will follow up with the patient as warranted. If labs normal, follow up in 1 year. Sooner with concerns Patient encouraged to call clinic with any questions, comments, or concerns.   Maximiano Coss, NP

## 2020-12-16 NOTE — Telephone Encounter (Signed)
Have seen pt in office since.  Thank you  Rich

## 2020-12-27 ENCOUNTER — Telehealth: Payer: Self-pay

## 2020-12-27 NOTE — Telephone Encounter (Signed)
Error

## 2021-02-06 ENCOUNTER — Ambulatory Visit (INDEPENDENT_AMBULATORY_CARE_PROVIDER_SITE_OTHER): Payer: 59 | Admitting: Registered Nurse

## 2021-02-06 DIAGNOSIS — Z23 Encounter for immunization: Secondary | ICD-10-CM | POA: Diagnosis not present

## 2021-02-07 ENCOUNTER — Ambulatory Visit: Payer: 59

## 2021-03-13 LAB — HM MAMMOGRAPHY

## 2021-03-14 ENCOUNTER — Encounter: Payer: Self-pay | Admitting: Registered Nurse

## 2021-07-08 ENCOUNTER — Ambulatory Visit (INDEPENDENT_AMBULATORY_CARE_PROVIDER_SITE_OTHER): Payer: 59 | Admitting: Orthopaedic Surgery

## 2021-07-08 ENCOUNTER — Ambulatory Visit (INDEPENDENT_AMBULATORY_CARE_PROVIDER_SITE_OTHER): Payer: 59

## 2021-07-08 DIAGNOSIS — G2589 Other specified extrapyramidal and movement disorders: Secondary | ICD-10-CM

## 2021-07-08 DIAGNOSIS — M25512 Pain in left shoulder: Secondary | ICD-10-CM | POA: Diagnosis not present

## 2021-07-08 DIAGNOSIS — G8929 Other chronic pain: Secondary | ICD-10-CM

## 2021-07-08 IMAGING — DX DG SHOULDER 2+V*L*
3 series · 3 of 3 positions shown · non-contrast
Comparison: None Available.

CLINICAL DATA: Left shoulder pain and tightness.

EXAM:
LEFT SHOULDER - 2+ VIEW

[shoulder grashey]
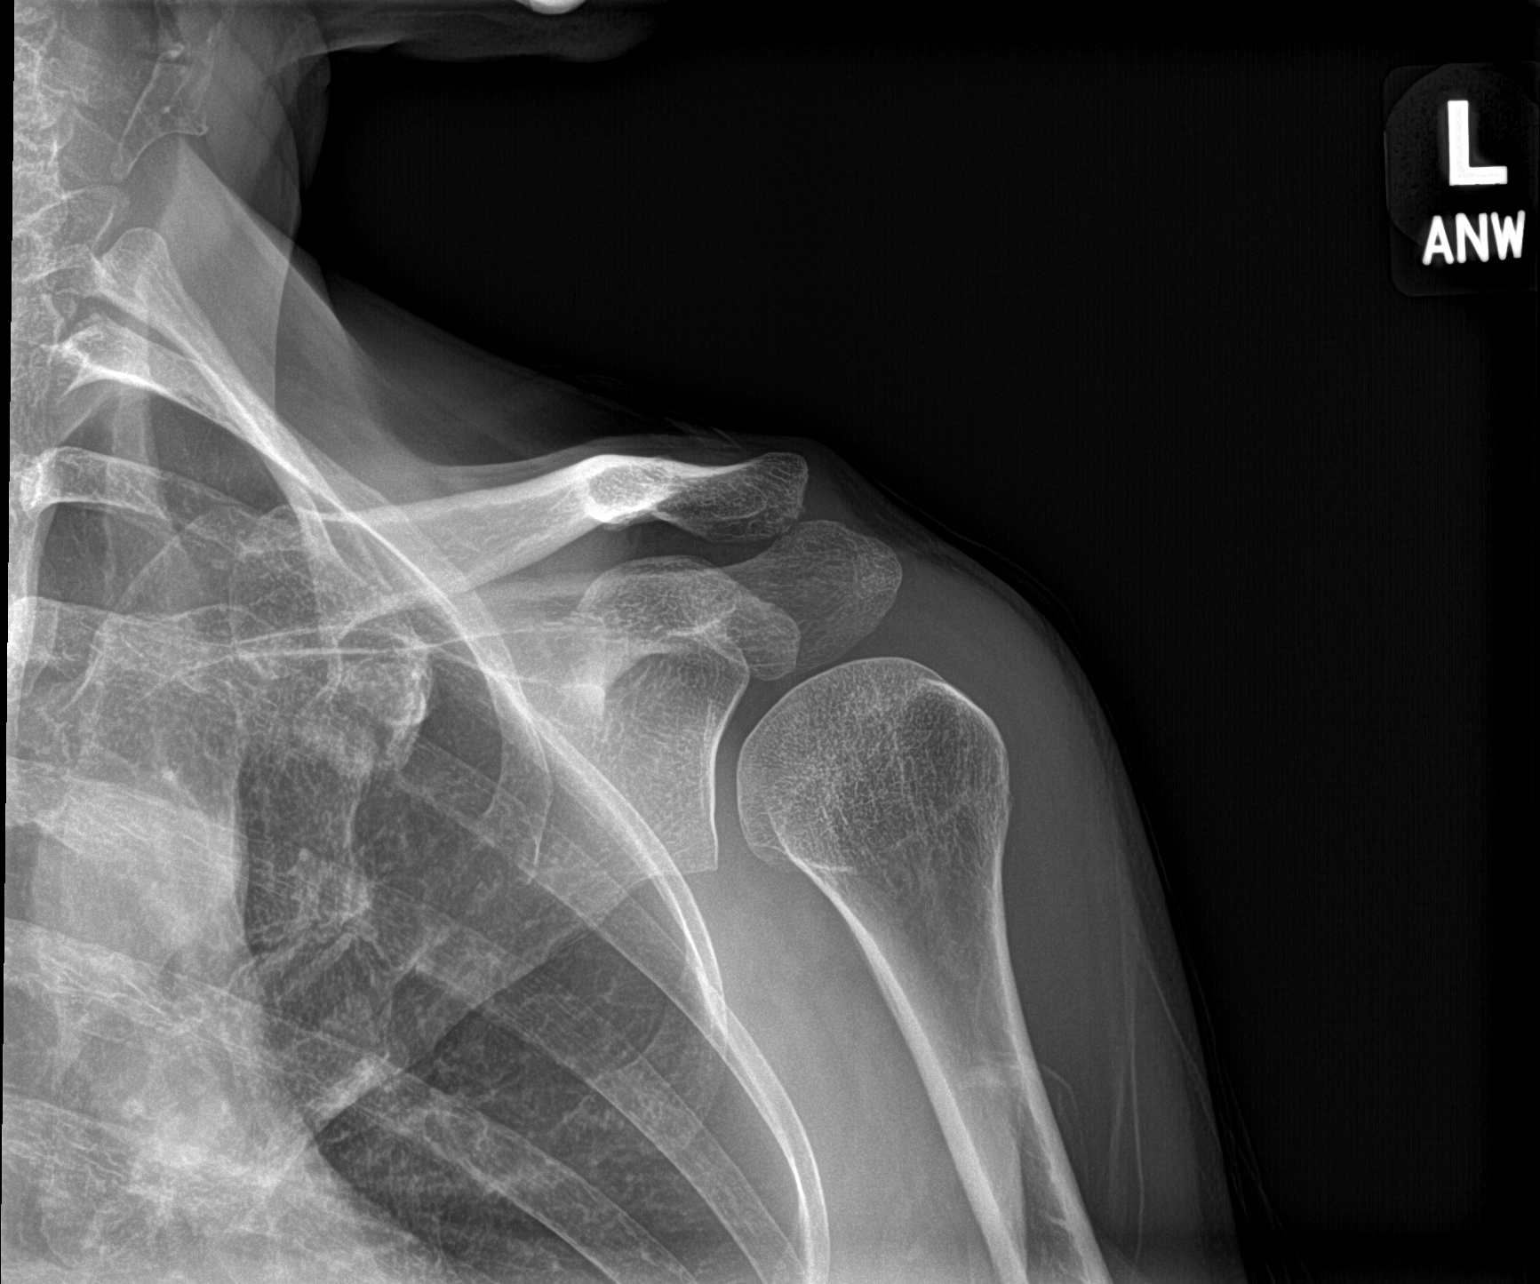

[shoulder y view]
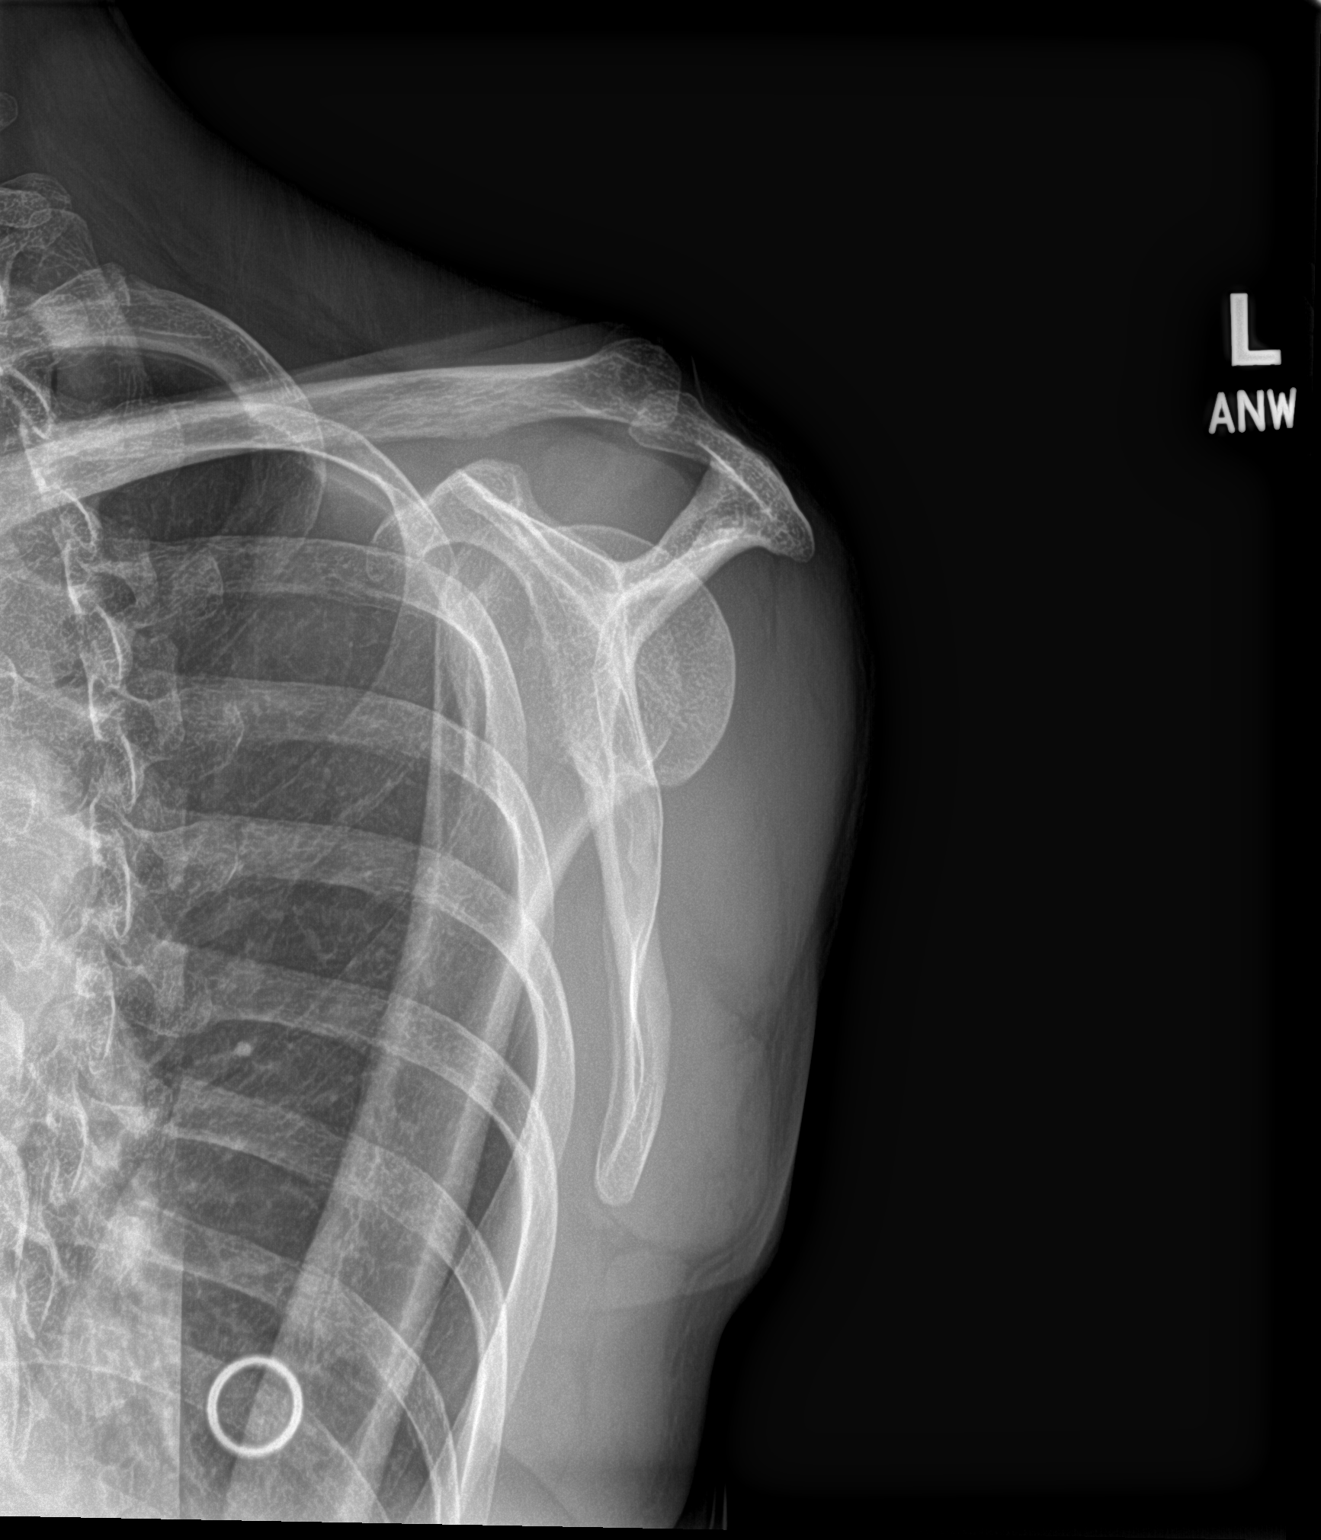

[shoulder axillary]
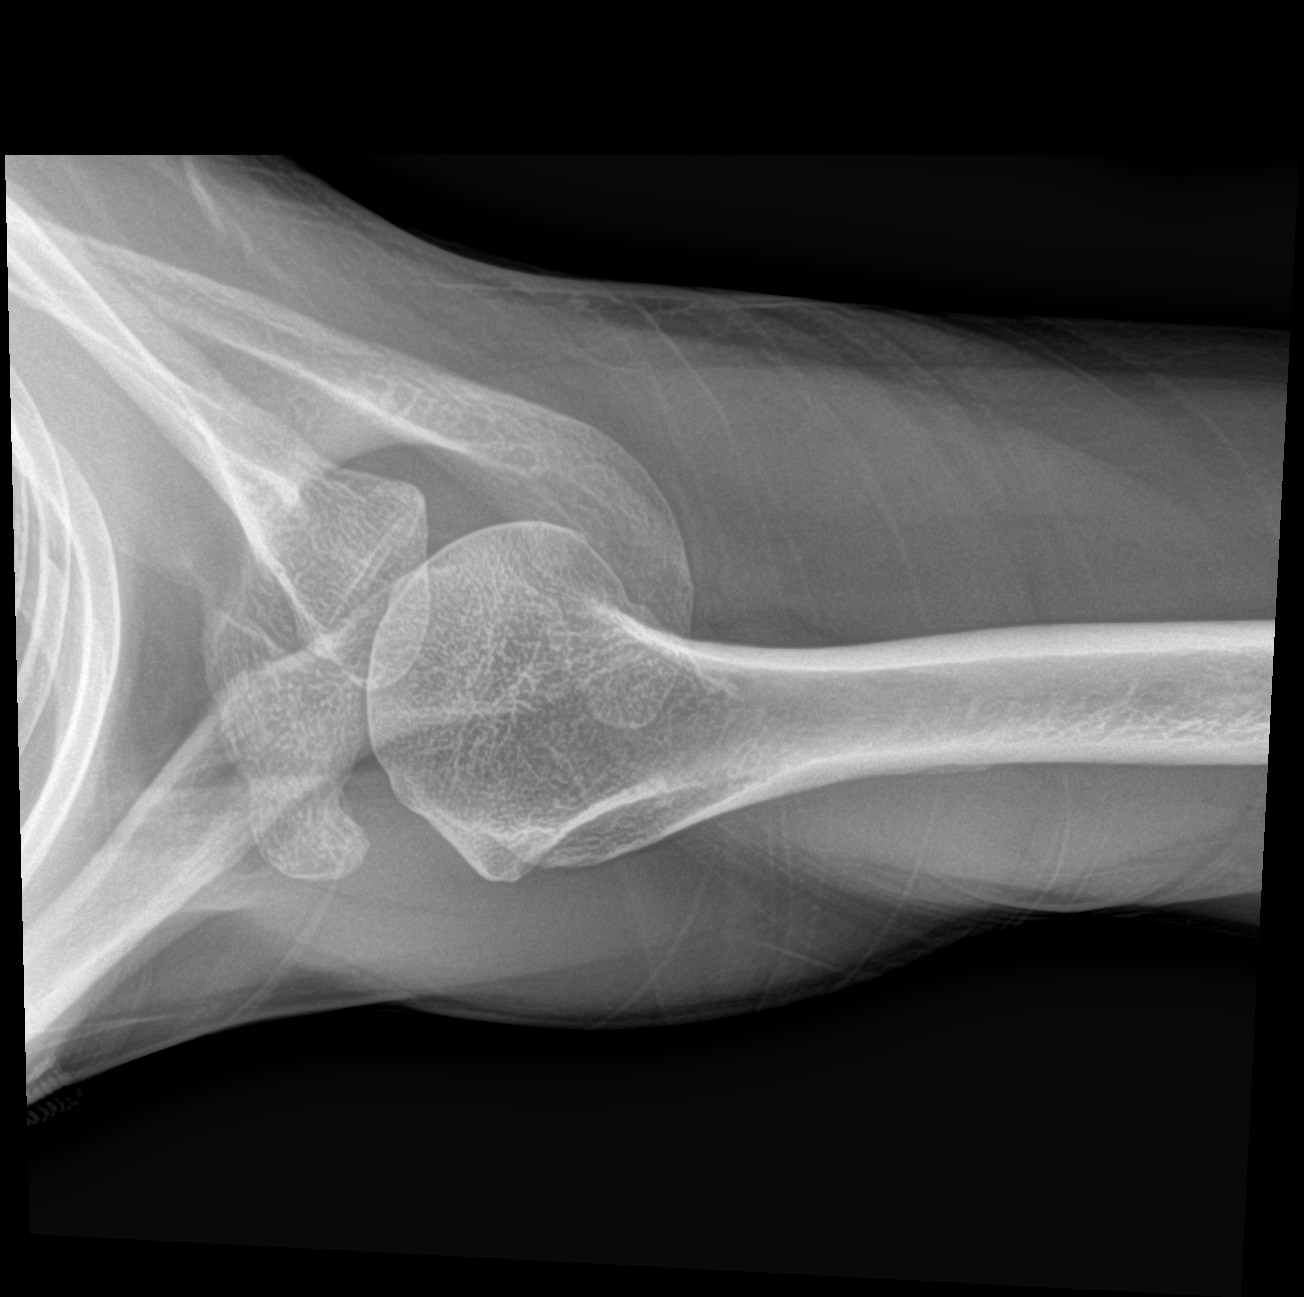

[3 of 3 positions shown; findings below may reference images not displayed]

FINDINGS: There is no evidence of fracture or dislocation. There is no
evidence of arthropathy or other focal bone abnormality. Soft
tissues are unremarkable.
IMPRESSION: Negative.

## 2021-07-08 NOTE — Progress Notes (Signed)
? ?                            ? ? ?Chief Complaint: Shoulder pain ?  ? ? ?History of Present Illness:  ? ? ?Katelyn Velazquez is a 56 y.o. female right-hand-dominant female presents with left shoulder pain has been ongoing now for several months.  She describes as a burning sharp pain in the upper trapezius.  She does not have any pain with laying directly on that side.  She is very active and enjoys doing light aerobics and yoga.  She says that this flares up after yoga.  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  She does use CBD oil which helps.  Denies any changes in range of motion.  She has full sensation bilateral upper extremities ? ? ? ?Surgical History:   ?None ? ?PMH/PSH/Family History/Social History/Meds/Allergies:   ? ?Past Medical History:  ?Diagnosis Date  ? Allergy   ? Arthritis   ? Family history of [place]   ? Hyperlipidemia   ? ?Past Surgical History:  ?Procedure Laterality Date  ? BREAST BIOPSY    ? [place]  ? KNEE ARTHROSCOPY  2018, 2019  ? ?Social History  ? ?Socioeconomic History  ? Marital status: Married  ?  Spouse name: Not on file  ? Number of children: 1  ? Years of education: 77  ? Highest education level: Not on file  ?Occupational History  ? Occupation: [employer]  ?Tobacco Use  ? Smoking status: Never  ? Smokeless tobacco: Never  ?Vaping Use  ? Vaping Use: Never used  ?Substance and Sexual Activity  ? Alcohol use: No  ? Drug use: No  ? Sexual activity: Yes  ?  Birth control/protection: Other-see comments  ?  Comment: vasectomy  ?Other Topics Concern  ? Not on file  ?Social History Narrative  ? Lives at home w/ her husband  ? Right-handed  ? Caffeine: 2-4 cups of coffee per day  ? ?Social Determinants of Health  ? ?Financial Resource Strain: Not on file  ?Food Insecurity: Not on file  ?Transportation Needs: Not on file  ?Physical Activity: Not on file  ?Stress: Not on file  ?Social Connections: Not on file  ? ?Family History   ?Problem Relation Age of Onset  ? Hypertension Mother   ? Parkinsonism Mother   ? Arthritis Mother   ? Cancer Father   ?     prostate  ? Stroke Sister 45  ?     brain anersym   ? Early death Sister   ? Cancer Brother   ?     prostate  ? Alcohol abuse Brother   ? Depression Brother   ? Hyperlipidemia Brother   ? Prostate cancer Brother   ? Colon cancer Neg Hx   ? Esophageal cancer Neg Hx   ? Rectal cancer Neg Hx   ? Stomach cancer Neg Hx   ? ?No Known Allergies ?Current Outpatient Medications  ?Medication Sig Dispense Refill  ? alendronate (FOSAMAX) 70 MG tablet Take 1 tablet (70 mg total) by mouth every 7 (seven) days. Take with a full glass of water on an empty stomach. 12 tablet 4  ? atorvastatin (LIPITOR) 10 MG tablet Take 1 tablet (10 mg total) by mouth daily. 90 tablet 3  ? calcium-vitamin D (OSCAL WITH D) 500-200 MG-UNIT tablet Take 2 tablets by mouth.    ?  diclofenac Sodium (VOLTAREN) 1 % GEL Apply topically 4 (four) times daily.    ? loratadine (CLARITIN) 10 MG tablet Take 10 mg by mouth daily.    ? meloxicam (MOBIC) 15 MG tablet TAKE 1 TABLET (15 MG TOTAL) BY MOUTH DAILY. 30 tablet 0  ? Omega-3 Fatty Acids (FISH OIL PO) Take by mouth.    ? ?No current facility-administered medications for this visit.  ? ?No results found. ? ?Review of Systems:   ?A ROS was performed including pertinent positives and negatives as documented in the HPI. ? ?Physical Exam :   ?Constitutional: NAD and appears stated age ?Neurological: Alert and oriented ?Psych: Appropriate affect and cooperative ?There were no vitals taken for this visit.  ? ?Comprehensive Musculoskeletal Exam:   ? ?Musculoskeletal Exam    ?Inspection Right Left  ?Skin No atrophy or winging Positive leaning on the left  ?Palpation    ?Tenderness None Upper trapezius  ?Range of Motion    ?Flexion (passive) 170 170  ?Flexion (active) 170 170  ?Abduction 170 170  ?ER at the side 70 70  ?Can reach behind back to T12 T12  ?Strength    ? Full Full  ?Special Tests     ?Pseudoparalytic No No  ?Neurologic    ?Fires PIN, radial, median, ulnar, musculocutaneous, axillary, suprascapular, long thoracic, and spinal accessory innervated muscles. No abnormal sensibility  ?Vascular/Lymphatic    ?Radial Pulse 2+ 2+  ?Cervical Exam    ?Patient has symmetric cervical range of motion with negative Spurling's test.  ?Special Test: None  ? ? ? ?Imaging:   ?Xray (3 views left shoulder): ?Normal ? ?I personally reviewed and interpreted the radiographs. ? ? ?Assessment:   ?56 y.o. 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  At this time I do believe that her trapezius is compensating and causing significant pain.  I have advised her on a specific figure-of-eight brace that can help with her postural correction.  I would also like her to undergo physical therapy for strengthening of her serratus as well as rhomboids.  She is also a candidate for dry needling ? ?Plan :   ? ?-Return to clinic in 6 weeks if no improvement ? ? ? ? ?I personally saw and evaluated the patient, and participated in the management and treatment plan. ? ?Vanetta Mulders, MD ?Attending Physician, Orthopedic Surgery ? ?This document was dictated using Systems analyst. A reasonable attempt at proof reading has been made to minimize errors. ?

## 2021-07-09 ENCOUNTER — Encounter (HOSPITAL_BASED_OUTPATIENT_CLINIC_OR_DEPARTMENT_OTHER): Payer: Self-pay | Admitting: Orthopaedic Surgery

## 2021-07-31 ENCOUNTER — Encounter: Payer: Self-pay | Admitting: Registered Nurse

## 2021-07-31 ENCOUNTER — Ambulatory Visit (INDEPENDENT_AMBULATORY_CARE_PROVIDER_SITE_OTHER): Payer: 59 | Admitting: Registered Nurse

## 2021-07-31 ENCOUNTER — Other Ambulatory Visit: Payer: Self-pay

## 2021-07-31 VITALS — BP 118/64 | HR 67 | Temp 98.4°F | Resp 18 | Ht 61.0 in | Wt 97.4 lb

## 2021-07-31 DIAGNOSIS — E785 Hyperlipidemia, unspecified: Secondary | ICD-10-CM | POA: Diagnosis not present

## 2021-07-31 DIAGNOSIS — Z0000 Encounter for general adult medical examination without abnormal findings: Secondary | ICD-10-CM | POA: Diagnosis not present

## 2021-07-31 DIAGNOSIS — M810 Age-related osteoporosis without current pathological fracture: Secondary | ICD-10-CM

## 2021-07-31 NOTE — Patient Instructions (Addendum)
Ms. Galen Malkowski to see you  No concerns on exam  Let's check labs and DEXA scan  I will let you know how results go  Typical course of alendronate is 3-5 years, though some sources suggest up to 10 years.   We can come to a mutual decision on this  Thanks,  Rich     If you have lab work done today you will be contacted with your lab results within the next 2 weeks.  If you have not heard from [place] then please contact us. The fastest way to get your results is to register for My Chart.   IF you received an x-ray today, you will receive an invoice from [facility]. Please contact [facility] at [phone] with questions or concerns regarding your invoice.   IF you received labwork today, you will receive an invoice from [facility]. Please contact [facility] at [phone] with questions or concerns regarding your invoice.   Our billing staff will not be able to assist you with questions regarding bills from these companies.  You will be contacted with the lab results as soon as they are available. The fastest way to get your results is to activate your My Chart account. Instructions are located on the last page of this paperwork. If you have not heard from [place] regarding the results in 2 weeks, please contact this office.

## 2021-07-31 NOTE — Progress Notes (Signed)
Complete physical exam  Patient: Katelyn Velazquez   DOB: [birth year]   56 y.o. Female  MRN: [id number] Visit Date: 07/31/2021  Subjective:    Chief Complaint  Patient presents with   Annual Exam    Patient states she is here for an CPE    Koby Hartfield is a 56 y.o. female who presents today for a complete physical exam. She reports consuming a general diet. Gym/ health club routine includes  . She generally feels well. She reports sleeping well. She does not have additional problems to discuss today.   Osteoporosis Hx of Has been on fosamax for around 2 years Last DEXA on 09/18/20 showed T score improved to -2.5 from -3.1 in L spine, -2.2 from -2.4 in L femur, -1.7 from -2.0 in femur mean. Interested in getting repeat screening set up. Would like to check on hormone levels as well  Otherwise doing well. No further concerns.    Vision:Within the last year Dental:Within Last 6 months STD Screen:No PSA:No Most recent fall risk assessment:    07/31/2021    2:32 PM  Datil in the past year? 0  Number falls in past yr: 0  Injury with Fall? 0  Risk for fall due to : No Fall Risks  Follow up Falls evaluation completed     Most recent depression screenings:    07/31/2021    2:32 PM 12/07/2020    9:11 AM  PHQ 2/9 Scores  PHQ - 2 Score 0 0  PHQ- 9 Score 0 3     Patient Active Problem List   Diagnosis Date Noted   Hyperlipidemia 09/28/2019   Osteoporosis without current pathological fracture 08/23/2019   Primary osteoarthritis involving multiple joints 08/23/2019   Visit for preventive health examination 08/23/2019    moyamoya family history 01/31/2016   Idiopathic scoliosis 07/01/2013   Past Medical History:  Diagnosis Date   Allergy    Arthritis    Family history of Moyamoya    Hyperlipidemia    Past Surgical History:  Procedure Laterality Date   BREAST BIOPSY     CESAREAN SECTION  1999   KNEE ARTHROSCOPY  2018, 2019   Social History   Tobacco  Use   Smoking status: Never   Smokeless tobacco: Never  Vaping Use   Vaping Use: Never used  Substance Use Topics   Alcohol use: No   Drug use: No   Social History   Socioeconomic History   Marital status: Married    Spouse name: Not on file   Number of children: 1   Years of education: 16   Highest education level: Not on file  Occupational History   Occupation: [employer]  Tobacco Use   Smoking status: Never   Smokeless tobacco: Never  Vaping Use   Vaping Use: Never used  Substance and Sexual Activity   Alcohol use: No   Drug use: No   Sexual activity: Yes    Birth control/protection: Other-see comments    Comment: vasectomy  Other Topics Concern   Not on file  Social History Narrative   Lives at home w/ her husband   Right-handed   Caffeine: 2-4 cups of coffee per day   Social Determinants of Health   Financial Resource Strain: Not on file  Food Insecurity: Not on file  Transportation Needs: Not on file  Physical Activity: Not on file  Stress: Not on file  Social Connections: Not on file  Intimate Partner Violence: Not  on file   Family Status  Relation Name Status   Mother  Alive   Father  Deceased   Sister  Deceased   Brother  Alive   Brother  Alive   Neg Hx  (Not Specified)   Family History  Problem Relation Age of Onset   Hypertension Mother    Parkinsonism Mother    Arthritis Mother    Cancer Father        prostate   Stroke Sister 45       brain anersym    Early death Sister    Cancer Brother        prostate   Alcohol abuse Brother    Depression Brother    Hyperlipidemia Brother    Prostate cancer Brother    Colon cancer Neg Hx    Esophageal cancer Neg Hx    Rectal cancer Neg Hx    Stomach cancer Neg Hx    Allergies  Allergen Reactions   Latex Itching   Bacitracin Rash   Bacitracin-Polymyxin B Rash     Patient Care Team: Maximiano Coss, NP as PCP - General (Adult Health Nurse Practitioner) Thamas Jaegers, RMA as Referring  Physician Huel Cote, NP (Inactive) as Nurse Practitioner (Obstetrics and Gynecology)   Medications: Outpatient Medications Prior to Visit  Medication Sig   alendronate (FOSAMAX) 70 MG tablet Take 1 tablet (70 mg total) by mouth every 7 (seven) days. Take with a full glass of water on an empty stomach.   atorvastatin (LIPITOR) 10 MG tablet Take 1 tablet (10 mg total) by mouth daily.   diclofenac Sodium (VOLTAREN) 1 % GEL Apply topically 4 (four) times daily.   Omega-3 Fatty Acids (FISH OIL PO) Take by mouth.   BINAXNOW COVID-19 AG HOME TEST KIT FOLLOW INSTRUCTIONS INCLUDED WITH THE PACKAGE.   Calcium Carbonate Antacid (CALCIUM CARBONATE, DOSED IN MG ELEMENTAL CALCIUM,) 1250 MG/5ML SUSP Take by mouth.   calcium-vitamin D (OSCAL WITH D) 500-200 MG-UNIT tablet Take 2 tablets by mouth.   estradiol (VIVELLE-DOT) 0.0375 MG/24HR Place onto the skin.   loratadine (CLARITIN) 10 MG tablet Take 10 mg by mouth daily.   meloxicam (MOBIC) 15 MG tablet TAKE 1 TABLET (15 MG TOTAL) BY MOUTH DAILY.   No facility-administered medications prior to visit.    Review of Systems  Constitutional: Negative.   HENT: Negative.    Eyes: Negative.   Respiratory: Negative.    Cardiovascular: Negative.   Gastrointestinal: Negative.   Genitourinary: Negative.   Musculoskeletal: Negative.   Skin: Negative.   Neurological: Negative.   Psychiatric/Behavioral: Negative.    All other systems reviewed and are negative.  Last CBC Lab Results  Component Value Date   WBC 2.9 (L) 12/07/2020   HGB 12.2 12/07/2020   HCT 36.7 12/07/2020   MCV 99.4 12/07/2020   MCH 28.0 01/22/2016   RDW 12.9 12/07/2020   PLT 201.0 22/48/2500   Last metabolic panel Lab Results  Component Value Date   GLUCOSE 76 12/07/2020   NA 134 (L) 12/07/2020   K 3.7 12/07/2020   CL 99 12/07/2020   CO2 28 12/07/2020   BUN 14 12/07/2020   CREATININE 0.80 12/07/2020   CALCIUM 9.1 12/07/2020   PROT 6.9 12/07/2020   ALBUMIN 4.6  12/07/2020   BILITOT 0.7 12/07/2020   ALKPHOS 38 (L) 12/07/2020   AST 16 12/07/2020   ALT 11 12/07/2020   Last lipids Lab Results  Component Value Date   CHOL 192 12/07/2020  HDL 82.30 12/07/2020   LDLCALC 101 (H) 12/07/2020   TRIG 44.0 12/07/2020   CHOLHDL 2 12/07/2020   Last hemoglobin A1c Lab Results  Component Value Date   HGBA1C 5.7 06/07/2020   Last thyroid functions Lab Results  Component Value Date   TSH 3.35 06/07/2020   Last vitamin D Lab Results  Component Value Date   VD25OH 42.37 12/07/2020   Last vitamin B12 and Folate No results found for: VITAMINB12, FOLATE      Objective:     BP 118/64   Pulse 67   Temp 98.4 F (36.9 C) (Temporal)   Resp 18   Ht _0  (1.549 m)   Wt 97 lb 6.4 oz (44.2 kg)   SpO2 97%   BMI 18.40 kg/m   BP Readings from Last 3 Encounters:  07/31/21 118/64  12/07/20 100/76  08/06/20 114/70   Wt Readings from Last 3 Encounters:  07/31/21 97 lb 6.4 oz (44.2 kg)  12/07/20 97 lb 6.4 oz (44.2 kg)  08/06/20 97 lb (44 kg)   SpO2 Readings from Last 3 Encounters:  07/31/21 97%  12/07/20 99%  06/07/20 98%      Physical Exam Vitals and nursing note reviewed.  Constitutional:      General: She is not in acute distress.    Appearance: Normal appearance. She is normal weight. She is not ill-appearing, toxic-appearing or diaphoretic.  HENT:     Head: Normocephalic and atraumatic.     Right Ear: Tympanic membrane, ear canal and external ear normal. There is no impacted cerumen.     Left Ear: Tympanic membrane, ear canal and external ear normal. There is no impacted cerumen.     Nose: Nose normal. No congestion or rhinorrhea.     Mouth/Throat:     Mouth: Mucous membranes are moist.     Pharynx: Oropharynx is clear. No oropharyngeal exudate or posterior oropharyngeal erythema.  Eyes:     General: No scleral icterus.       Right eye: No discharge.        Left eye: No discharge.     Extraocular Movements: Extraocular  movements intact.     Conjunctiva/sclera: Conjunctivae normal.     Pupils: Pupils are equal, round, and reactive to light.  Neck:     Vascular: No carotid bruit.  Cardiovascular:     Rate and Rhythm: Normal rate and regular rhythm.     Pulses: Normal pulses.     Heart sounds: Normal heart sounds. No murmur heard.   No friction rub. No gallop.  Pulmonary:     Effort: Pulmonary effort is normal. No respiratory distress.     Breath sounds: Normal breath sounds. No stridor. No wheezing, rhonchi or rales.  Chest:     Chest wall: No tenderness.  Abdominal:     General: Abdomen is flat. Bowel sounds are normal. There is no distension.     Palpations: There is no mass.     Tenderness: There is no abdominal tenderness. There is no right CVA tenderness, left CVA tenderness, guarding or rebound.     Hernia: No hernia is present.  Musculoskeletal:        General: No swelling, tenderness, deformity or signs of injury. Normal range of motion.     Cervical back: Normal range of motion and neck supple. No rigidity or tenderness.     Right lower leg: No edema.     Left lower leg: No edema.  Lymphadenopathy:  Cervical: No cervical adenopathy.  Skin:    General: Skin is warm and dry.     Capillary Refill: Capillary refill takes less than 2 seconds.     Coloration: Skin is not jaundiced or pale.     Findings: No bruising, erythema, lesion or rash.  Neurological:     General: No focal deficit present.     Mental Status: She is alert and oriented to person, place, and time. Mental status is at baseline.     Cranial Nerves: No cranial nerve deficit.     Sensory: No sensory deficit.     Motor: No weakness.     Coordination: Coordination normal.     Gait: Gait normal.     Deep Tendon Reflexes: Reflexes normal.  Psychiatric:        Mood and Affect: Mood normal.        Behavior: Behavior normal.        Thought Content: Thought content normal.        Judgment: Judgment normal.     No results  found for any visits on 07/31/21.    Assessment & Plan:    Routine Health Maintenance and Physical Exam  Immunization History  Administered Date(s) Administered   Influenza,inj,Quad PF,6+ Mos 12/13/2019   Influenza-Unspecified 11/26/2016, 11/28/2020   PFIZER(Purple Top)SARS-COV-2 Vaccination 05/02/2019, 06/10/2019, 01/13/2020, 06/25/2020, 11/28/2020   Tdap 03/27/2017   Zoster Recombinat (Shingrix) 12/07/2020, 02/06/2021    Health Maintenance  Topic Date Due   COVID-19 Vaccine (6 - Booster for [place] series) 08/16/2021 (Originally 01/23/2021)   HIV Screening  08/01/2022 (Originally [birth year]/29/1978)   INFLUENZA VACCINE  09/24/2021   MAMMOGRAM  03/13/2022   PAP SMEAR-Modifier  07/26/2022   COLONOSCOPY (Pts 45-34yr Insurance coverage will need to be confirmed)  02/11/2025   TETANUS/TDAP  03/28/2027   Hepatitis C Screening  Completed   Zoster Vaccines- Shingrix  Completed   HPV VACCINES  Aged Out    Discussed health benefits of physical activity, and encouraged her to engage in regular exercise appropriate for her age and condition.  Problem List Items Addressed This Visit       Musculoskeletal and Integument   Osteoporosis without current pathological fracture   Relevant Orders   Comprehensive metabolic panel   Hemoglobin A1c   CBC with Differential/Platelet   Lipid panel   TSH   Vitamin D (25 hydroxy)   PTH, Intact and Calcium   FSH/LH   Estrogens, Total     Other   Hyperlipidemia   Relevant Orders   Comprehensive metabolic panel   Hemoglobin A1c   CBC with Differential/Platelet   Lipid panel   TSH   Vitamin D (25 hydroxy)   PTH, Intact and Calcium   Other Visit Diagnoses     Annual physical exam    -  Primary      Return in about 1 year (around 08/01/2022) for CPE and labs.     PLAN Exam unremarkable Labs collected. Will follow up with the patient as warranted. Patient encouraged to call clinic with any questions, comments, or concerns.  RMaximiano Coss  NP

## 2021-08-01 ENCOUNTER — Other Ambulatory Visit: Payer: Self-pay | Admitting: Registered Nurse

## 2021-08-01 DIAGNOSIS — R7989 Other specified abnormal findings of blood chemistry: Secondary | ICD-10-CM

## 2021-08-01 LAB — COMPREHENSIVE METABOLIC PANEL
ALT: 14 U/L (ref 0–35)
AST: 17 U/L (ref 0–37)
Albumin: 4.7 g/dL (ref 3.5–5.2)
Alkaline Phosphatase: 52 U/L (ref 39–117)
BUN: 17 mg/dL (ref 6–23)
CO2: 28 mEq/L (ref 19–32)
Calcium: 9.7 mg/dL (ref 8.4–10.5)
Chloride: 100 mEq/L (ref 96–112)
Creatinine, Ser: 0.83 mg/dL (ref 0.40–1.20)
GFR: 78.12 mL/min (ref >60.00)
Glucose, Bld: 82 mg/dL (ref 70–99)
Potassium: 4 mEq/L (ref 3.5–5.1)
Sodium: 137 mEq/L (ref 135–145)
Total Bilirubin: 0.4 mg/dL (ref 0.2–1.2)
Total Protein: 7.4 g/dL (ref 6.0–8.3)

## 2021-08-01 LAB — LIPID PANEL
Cholesterol: 171 mg/dL (ref 0–200)
HDL: 83.5 mg/dL (ref >39.00)
LDL Cholesterol: 81 mg/dL (ref 0–99)
NonHDL: 87.41
Total CHOL/HDL Ratio: 2
Triglycerides: 30 mg/dL (ref 0.0–149.0)
VLDL: 6 mg/dL (ref 0.0–40.0)

## 2021-08-01 LAB — CBC WITH DIFFERENTIAL/PLATELET
Basophils Absolute: 0 10*3/uL (ref 0.0–0.1)
Basophils Relative: 0.9 % (ref 0.0–3.0)
Eosinophils Absolute: 0.2 10*3/uL (ref 0.0–0.7)
Eosinophils Relative: 5.6 % — ABNORMAL HIGH (ref 0.0–5.0)
HCT: 38.8 % (ref 36.0–46.0)
Hemoglobin: 13.3 g/dL (ref 12.0–15.0)
Lymphocytes Relative: 33.9 % (ref 12.0–46.0)
Lymphs Abs: 1.2 10*3/uL (ref 0.7–4.0)
MCHC: 34.2 g/dL (ref 30.0–36.0)
MCV: 97.9 fl (ref 78.0–100.0)
Monocytes Absolute: 0.4 10*3/uL (ref 0.1–1.0)
Monocytes Relative: 10.9 % (ref 3.0–12.0)
Neutro Abs: 1.8 10*3/uL (ref 1.4–7.7)
Neutrophils Relative %: 48.7 % (ref 43.0–77.0)
Platelets: 218 10*3/uL (ref 150.0–400.0)
RBC: 3.96 Mil/uL (ref 3.87–5.11)
RDW: 12.8 % (ref 11.5–15.5)
WBC: 3.7 10*3/uL — ABNORMAL LOW (ref 4.0–10.5)

## 2021-08-01 LAB — TSH: TSH: 0.05 u[IU]/mL — ABNORMAL LOW (ref 0.35–5.50)

## 2021-08-01 LAB — VITAMIN D 25 HYDROXY (VIT D DEFICIENCY, FRACTURES): VITD: 89.25 ng/mL (ref 30.00–100.00)

## 2021-08-01 LAB — HEMOGLOBIN A1C: Hgb A1c MFr Bld: 5.7 % (ref 4.6–6.5)

## 2021-08-05 LAB — ESTROGENS, TOTAL: Estrogen: 127.5 pg/mL

## 2021-08-05 LAB — PTH, INTACT AND CALCIUM
Calcium: 9.7 mg/dL (ref 8.6–10.4)
PTH: 13 pg/mL — ABNORMAL LOW (ref 16–77)

## 2021-08-05 LAB — FSH/LH
FSH: 89 m[IU]/mL
LH: 38.9 m[IU]/mL

## 2021-08-06 ENCOUNTER — Telehealth: Payer: Self-pay | Admitting: Registered Nurse

## 2021-08-06 DIAGNOSIS — M810 Age-related osteoporosis without current pathological fracture: Secondary | ICD-10-CM

## 2021-08-06 NOTE — Telephone Encounter (Signed)
Last had DEXA in July 2022, should pt have repeat at this time? Just had annual on 07/31/21

## 2021-08-06 NOTE — Telephone Encounter (Signed)
PT stats she called her insurance company. [employer] told Pt they would need a request from the provider. [employer] fax number is [phone].

## 2021-08-06 NOTE — Telephone Encounter (Signed)
Called lm asking her to check with insurance and then call back so we can order this

## 2021-08-06 NOTE — Telephone Encounter (Signed)
Per review of last note with Rich: "Last DEXA on 09/18/20 showed T score improved to -2.5 from -3.1 in L spine, -2.2 from -2.4 in L femur, -1.7 from -2.0 in femur mean. Interested in getting repeat screening set up."  Appears they wanted to schedule repeat scan. Would recommend checking on insurance coverage to make sure it will cover repeat scan at one year, and if so ok to order.

## 2021-08-06 NOTE — Telephone Encounter (Signed)
Pt called stating that she need a referral for a bone scan.

## 2021-08-07 ENCOUNTER — Encounter: Payer: Self-pay | Admitting: Nurse Practitioner

## 2021-08-07 ENCOUNTER — Ambulatory Visit (INDEPENDENT_AMBULATORY_CARE_PROVIDER_SITE_OTHER): Payer: 59 | Admitting: Nurse Practitioner

## 2021-08-07 VITALS — BP 116/74 | Ht 61.0 in | Wt 95.0 lb

## 2021-08-07 DIAGNOSIS — Z79890 Hormone replacement therapy (postmenopausal): Secondary | ICD-10-CM | POA: Diagnosis not present

## 2021-08-07 DIAGNOSIS — M810 Age-related osteoporosis without current pathological fracture: Secondary | ICD-10-CM

## 2021-08-07 DIAGNOSIS — Z01419 Encounter for gynecological examination (general) (routine) without abnormal findings: Secondary | ICD-10-CM | POA: Diagnosis not present

## 2021-08-07 NOTE — Telephone Encounter (Signed)
Faxed order as pt requested to listed fax

## 2021-08-07 NOTE — Progress Notes (Signed)
Katelyn Velazquez 1965/12/09 [id number]   History:  56 y.o. G1P1 presents for annual exam without GYN complaints. Postmenopausal. Started on HRT a few months ago by [facility] for trouble sleeping. Stopped use after 2 months due to spotting, but restarted a couple of weeks ago to help with insomnia as it works well. Normal pap and mammogram history. 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   Gynecologic History No LMP recorded. Patient is postmenopausal.   Contraception/Family planning: post menopausal status and vasectomy Sexually active: Yes  Health Maintenance Last Pap: 07/26/2019. Results were: Normal, 5-year repeat Last mammogram: 03/13/2021. Results were: Normal Last colonoscopy: 02/12/2015. Results were: benign polyp, 10-year recall Last Dexa: 09/18/2020. Results were: T-score -2.5 of spine (3.1 in 2020)  Past medical history, past surgical history, family history and social history were all reviewed and documented in the EPIC chart. 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   ROS:  A ROS was performed and pertinent positives and negatives are included.  Exam:  Vitals:   08/07/21 1158  BP: 116/74  Weight: 95 lb (43.1 kg)  Height: '5\' 1"'$  (1.549 m)    Body mass index is 17.95 kg/m.  General appearance:  Normal Thyroid:  Symmetrical, normal in size, without palpable masses or nodularity. Respiratory  Auscultation:  Clear without wheezing or rhonchi Cardiovascular  Auscultation:  Regular rate, without rubs, murmurs or gallops  Edema/varicosities:  Not grossly evident Abdominal  Soft,nontender, without masses, guarding or rebound.  Liver/spleen:  No organomegaly noted  Hernia:  None appreciated  Skin  Inspection:  Grossly normal Breasts: Examined lying and sitting.   Right: Without masses, retractions, nipple discharge or axillary adenopathy.   Left: Without masses,  retractions, nipple discharge or axillary adenopathy. Genitourinary   Inguinal/mons:  Normal without inguinal adenopathy  External genitalia:  Normal appearing vulva with no masses, tenderness, or lesions  BUS/Urethra/Skene's glands:  Normal  Vagina:  Normal appearing with normal color and discharge, no lesions  Cervix:  Normal appearing without discharge or lesions  Uterus:  Normal in size, shape and contour.  Midline and mobile, nontender  Adnexa/parametria:     Rt: Normal in size, without masses or tenderness.   Lt: Normal in size, without masses or tenderness.  Anus and perineum: Normal  Digital rectal exam: Normal sphincter tone without palpated masses or tenderness  Patient informed chaperone available to be present for breast and pelvic exam. Patient has requested no chaperone to be present. Patient has been advised what will be completed during breast and pelvic exam.   Assessment/Plan:  56 y.o. G1P1 for annual exam.   Well female exam with routine gynecological exam - Education provided on SBEs, importance of preventative screenings, current guidelines, high calcium diet, regular exercise, and multivitamin daily. Labs with PCP.   Postmenopausal hormone therapy - managed by [facility]. Initiated for trouble sleeping. Stopped use after 2 months due to spotting, but restarted a couple of weeks ago to help with insomnia as it works well. Discussed risks of blood clots, heart attack, stroke, and breast cancer as well as the benefits of symptom management, bone, and heart health.  Osteoporosis without current pathological fracture, unspecified osteoporosis type. Managed by PCP, on Fosamax since 07/2019. DXA last year - T-score -2.5 in spine (3.1 in 2020).   Screening for cervical cancer - Normal Pap history.  Will repeat at 5-year interval per guidelines.  Screening for  breast cancer - Normal mammogram history.  Continue annual screenings.  Normal breast exam  today.  Screening for colon cancer - 2016 colonoscopy. Will repeat at GI's recommended interval.   Return in 1 year for annual.      Tamela Gammon DNP, 12:22 PM 08/07/2021

## 2021-08-12 ENCOUNTER — Ambulatory Visit (HOSPITAL_BASED_OUTPATIENT_CLINIC_OR_DEPARTMENT_OTHER): Admission: RE | Admit: 2021-08-12 | Payer: 59 | Source: Ambulatory Visit

## 2021-08-13 ENCOUNTER — Inpatient Hospital Stay (HOSPITAL_BASED_OUTPATIENT_CLINIC_OR_DEPARTMENT_OTHER): Admission: RE | Admit: 2021-08-13 | Payer: 59 | Source: Ambulatory Visit

## 2021-08-23 ENCOUNTER — Telehealth: Payer: Self-pay | Admitting: Registered Nurse

## 2021-08-23 NOTE — Telephone Encounter (Signed)
PT called stating that she need her bone scan order sent to [facility].

## 2021-08-27 ENCOUNTER — Encounter: Payer: Self-pay | Admitting: Registered Nurse

## 2021-08-28 ENCOUNTER — Encounter: Payer: Self-pay | Admitting: Nurse Practitioner

## 2021-08-28 ENCOUNTER — Other Ambulatory Visit: Payer: Self-pay | Admitting: Registered Nurse

## 2021-08-28 DIAGNOSIS — E785 Hyperlipidemia, unspecified: Secondary | ICD-10-CM

## 2021-08-28 DIAGNOSIS — M810 Age-related osteoporosis without current pathological fracture: Secondary | ICD-10-CM

## 2021-08-28 MED ORDER — ALENDRONATE SODIUM 70 MG PO TABS: 70.0000 mg | ORAL_TABLET | ORAL | 4 refills | Status: DC

## 2021-08-28 MED ORDER — ATORVASTATIN CALCIUM 10 MG PO TABS: 10.0000 mg | ORAL_TABLET | Freq: Every day | ORAL | 3 refills | Status: DC

## 2021-09-17 ENCOUNTER — Encounter: Payer: 59 | Admitting: Registered Nurse

## 2021-09-23 ENCOUNTER — Ambulatory Visit (INDEPENDENT_AMBULATORY_CARE_PROVIDER_SITE_OTHER): Payer: 59 | Admitting: Physician Assistant

## 2021-09-23 ENCOUNTER — Encounter: Payer: Self-pay | Admitting: Physician Assistant

## 2021-09-23 VITALS — BP 90/60 | HR 61 | Temp 97.3°F | Ht 61.0 in | Wt 93.0 lb

## 2021-09-23 DIAGNOSIS — G4700 Insomnia, unspecified: Secondary | ICD-10-CM | POA: Diagnosis not present

## 2021-09-23 DIAGNOSIS — R7989 Other specified abnormal findings of blood chemistry: Secondary | ICD-10-CM | POA: Diagnosis not present

## 2021-09-23 DIAGNOSIS — M810 Age-related osteoporosis without current pathological fracture: Secondary | ICD-10-CM | POA: Diagnosis not present

## 2021-09-23 DIAGNOSIS — M7989 Other specified soft tissue disorders: Secondary | ICD-10-CM | POA: Diagnosis not present

## 2021-09-23 LAB — URINALYSIS, ROUTINE W REFLEX MICROSCOPIC
Bilirubin Urine: NEGATIVE
Hgb urine dipstick: NEGATIVE
Ketones, ur: NEGATIVE
Leukocytes,Ua: NEGATIVE
Nitrite: NEGATIVE
Specific Gravity, Urine: 1.01 (ref 1.000–1.030)
Total Protein, Urine: NEGATIVE
Urine Glucose: NEGATIVE
Urobilinogen, UA: 0.2 (ref 0.0–1.0)
pH: 7.5 (ref 5.0–8.0)

## 2021-09-23 LAB — CBC WITH DIFFERENTIAL/PLATELET
Basophils Absolute: 0 10*3/uL (ref 0.0–0.1)
Basophils Relative: 0.7 % (ref 0.0–3.0)
Eosinophils Absolute: 0.1 10*3/uL (ref 0.0–0.7)
Eosinophils Relative: 2.8 % (ref 0.0–5.0)
HCT: 38.7 % (ref 36.0–46.0)
Hemoglobin: 13 g/dL (ref 12.0–15.0)
Lymphocytes Relative: 40.5 % (ref 12.0–46.0)
Lymphs Abs: 1.8 10*3/uL (ref 0.7–4.0)
MCHC: 33.6 g/dL (ref 30.0–36.0)
MCV: 97.6 fl (ref 78.0–100.0)
Monocytes Absolute: 0.5 10*3/uL (ref 0.1–1.0)
Monocytes Relative: 10.9 % (ref 3.0–12.0)
Neutro Abs: 2 10*3/uL (ref 1.4–7.7)
Neutrophils Relative %: 45.1 % (ref 43.0–77.0)
Platelets: 201 10*3/uL (ref 150.0–400.0)
RBC: 3.97 Mil/uL (ref 3.87–5.11)
RDW: 12.3 % (ref 11.5–15.5)
WBC: 4.4 10*3/uL (ref 4.0–10.5)

## 2021-09-23 LAB — COMPREHENSIVE METABOLIC PANEL
ALT: 14 U/L (ref 0–35)
AST: 19 U/L (ref 0–37)
Albumin: 4.3 g/dL (ref 3.5–5.2)
Alkaline Phosphatase: 34 U/L — ABNORMAL LOW (ref 39–117)
BUN: 17 mg/dL (ref 6–23)
CO2: 27 mEq/L (ref 19–32)
Calcium: 9.2 mg/dL (ref 8.4–10.5)
Chloride: 104 mEq/L (ref 96–112)
Creatinine, Ser: 0.75 mg/dL (ref 0.40–1.20)
GFR: 88.13 mL/min (ref >60.00)
Glucose, Bld: 87 mg/dL (ref 70–99)
Potassium: 4.6 mEq/L (ref 3.5–5.1)
Sodium: 139 mEq/L (ref 135–145)
Total Bilirubin: 0.6 mg/dL (ref 0.2–1.2)
Total Protein: 7.2 g/dL (ref 6.0–8.3)

## 2021-09-23 LAB — TSH: TSH: 0.02 u[IU]/mL — ABNORMAL LOW (ref 0.35–5.50)

## 2021-09-23 NOTE — Progress Notes (Signed)
Katelyn Velazquez is a 56 y.o. female here for a new problem of leg swelling.   History of Present Illness:   Chief Complaint  Patient presents with   Tansfer of care   Edema    Pt c/o bilateral lower extremity edema for the past 6 months.    HPI  LE Swelling  Patient here to transfer of care. She complain of bilateral LE swelling for the past 6 months. States this has been going on for years but recently has gotten more frequent. States her symptoms usually starts during the end of day. She has noticed more swelling and "tight" leg. Reports she has been noticing socks marks after wearing socks. Worse during the weekends. States symptoms are worse with standing. She states she has noticed severe swelling when standing on her feet for long long period of time while meal prepping.  States she has been drinking enough fluids. She drinks decaf coffee and tea throughout the day. About 8 cups mix of coffee and tea. Denies leg or calf. Denies discoloration of legs. Denies increased salt intake. Denies chest pain or SOB.   Osteoporosis  Patient is currently compliant with taking Fosamax 70 mg daily with no complications. Was started on this about 2 years ago. Tolerating this well and denies any concerns. Last Dexa was done on 09/18/2020. She would like this to be updated at this time.   Hypothyroidism  Patient states she has seen [facility] for insomnia. At that time, she had work-up which showed low thyroid level.  She is currently taking Armour Thyroid 30 mg daily with no complications. She has noticed unintentional weight loss for the past several months. She is concerned about this issue. Has not exercising regularly due to arthritis. She would like to rechecked this today. Denies any other concerns.  Insomnia  Patient expresses that she has been struggling to sleep for past few years. States she has been sleeping 5 hours during the night. This is mostly if she takes herbal  supplements at night. She has seen [facility] for trouble sleeping. At that time, she was recommended to take hormonal supplements. States she is scared to take this due to side effects. She has tried CBD oil in the past with no improvement. States she usually sleep about 3 hours without taking any medications. Usually does not fall back to sleep until 3:30 am at night.  No other specific treatment tried. Denies any other concerns.   Past Medical History:  Diagnosis Date   Allergy    Arthritis    Family history of Moyamoya    Hyperlipidemia    Thyroid disease      Social History   Tobacco Use   Smoking status: Never   Smokeless tobacco: Never  Vaping Use   Vaping Use: Never used  Substance Use Topics   Alcohol use: No   Drug use: No    Past Surgical History:  Procedure Laterality Date   BREAST BIOPSY     CESAREAN SECTION  1999   KNEE ARTHROSCOPY  2018, 2019    Family History  Problem Relation Age of Onset   Hypertension Mother    Parkinsonism Mother    Arthritis Mother    Cancer Father        prostate   Stroke Sister 65       brain anersym    Early death Sister    Cancer Brother        prostate   Alcohol  abuse Brother    Depression Brother    Hyperlipidemia Brother    Prostate cancer Brother    Colon cancer Neg Hx    Esophageal cancer Neg Hx    Rectal cancer Neg Hx    Stomach cancer Neg Hx     Allergies  Allergen Reactions   Latex Itching   Bacitracin Rash   Bacitracin-Polymyxin B Rash    Current Medications:   Current Outpatient Medications:    alendronate (FOSAMAX) 70 MG tablet, Take 1 tablet (70 mg total) by mouth every 7 (seven) days. Take with a full glass of water on an empty stomach., Disp: 12 tablet, Rfl: 4   ARMOUR THYROID 30 MG tablet, Take 30 mg by mouth every morning., Disp: , Rfl:    atorvastatin (LIPITOR) 10 MG tablet, Take 1 tablet (10 mg total) by mouth daily., Disp: 90 tablet, Rfl: 3   BINAXNOW COVID-19 AG HOME TEST  KIT, FOLLOW INSTRUCTIONS INCLUDED WITH THE PACKAGE., Disp: , Rfl:    Cholecalciferol (VITAMIN D) 125 MCG (5000 UT) CAPS, Take 1 capsule by mouth daily in the afternoon., Disp: , Rfl:    clobetasol (TEMOVATE) 0.05 % external solution, 1 application Externally Twice a day for 10 day(s), Disp: , Rfl:    desonide (DESONATE) 2.84 % gel, 1 application to affected area Externally Twice a day for 2 weeks, Disp: , Rfl:    estradiol (VIVELLE-DOT) 0.0375 MG/24HR, Place onto the skin., Disp: , Rfl:    Omega-3 Fatty Acids (FISH OIL PO), Take by mouth., Disp: , Rfl:    progesterone (PROMETRIUM) 100 MG capsule, Take 100 mg by mouth at bedtime., Disp: , Rfl:    triamcinolone cream (KENALOG) 0.1 %, 1 application topically BID for 14 days, Disp: , Rfl:    VITAMIN D PO, Take by mouth., Disp: , Rfl:    Review of Systems:   ROS Negative unless otherwise specified per HPI.   Vitals:   Vitals:   09/23/21 0807  BP: 90/60  Pulse: 61  Temp: (!) 97.3 F (36.3 C)  TempSrc: Temporal  SpO2: 97%  Weight: 93 lb (42.2 kg)  Height: 5' 1"  (1.549 m)     Body mass index is 17.57 kg/m.  Physical Exam:   Physical Exam Vitals and nursing note reviewed.  Constitutional:      General: She is not in acute distress.    Appearance: She is well-developed. She is not ill-appearing or toxic-appearing.  Cardiovascular:     Rate and Rhythm: Normal rate and regular rhythm.     Pulses: Normal pulses.     Heart sounds: Normal heart sounds, S1 normal and S2 normal.  Pulmonary:     Effort: Pulmonary effort is normal.     Breath sounds: Normal breath sounds.  Musculoskeletal:     Comments: No swelling/tenderness/erythema in b/l legs  Skin:    General: Skin is warm and dry.  Neurological:     Mental Status: She is alert.     GCS: GCS eye subscore is 4. GCS verbal subscore is 5. GCS motor subscore is 6.  Psychiatric:        Speech: Speech normal.        Behavior: Behavior normal. Behavior is cooperative.       Assessment and Plan:   Low TSH level Discussed with her that I recommend her seeing an endocrinologist for this She is going to follow-up with her integrative provider first and then will decide if she would like to see endocrinology  I discussed that abnormal TSH can contribute to weight and insomnia Recheck today  Osteoporosis without current pathological fracture, unspecified osteoporosis type She is very concerned about this Continue fosamax I recommended consideration of DEXA at another location and put this order in for her  Swelling of lower extremity No red flags Suspect possible dependent edema Advised as follows: "Please work on elevating your legs, salt reduction, and compression stockings. If this does not help your swelling, we can order ultrasounds of legs to look at blood flow and an ultrasound of your heart to evaluate the function of your heart.  Keep me posted."  Insomnia, unspecified type Declines any intervention from [place] regarding this Again recommended Tsh recheck and low threshold to refer to endo for evaluation if abnormal Follow-up with [place] prn  I,Savera Zaman,acting as a scribe for [employer], PA.,have documented all relevant documentation on the behalf of Inda Coke, PA,as directed by  Inda Coke, PA while in the presence of Inda Coke, [place].   I, Inda Coke, [place], have reviewed all documentation for this visit. The documentation on 09/23/21 for the exam, diagnosis, procedures, and orders are all accurate and complete.   Inda Coke, PA-C

## 2021-09-23 NOTE — Patient Instructions (Signed)
It was great to see you!  I would like to send you to endocrinology for your abnormal thyroid test, keep me posted on this decision Over-active thyroid can cause issues with sleep and weight  You can complete your DEXA with Sebastian -- please ask our front desk about scheduling this if you'd like  Please work on elevating your legs, salt reduction, and compression stockings. If this does not help your swelling, we can order ultrasounds of legs to look at blood flow and an ultrasound of your heart to evaluate the function of your heart.  Keep me posted.  Let's follow-up in 1 month if still having issues, sooner if you have concerns.  If blood work, urine studies, or any imaging was ordered today -->  we will release your results to you on your MyChart account (if you have chosen to sign up for this) with further instructions. You may see the results before I do, but when I review them I will send you a message with my report or have my staff call you if things need to be discussed. Please reply to my message with any questions.   Take care,  Inda Coke PA-C

## 2021-10-16 ENCOUNTER — Other Ambulatory Visit: Payer: Self-pay | Admitting: Physician Assistant

## 2021-10-16 ENCOUNTER — Encounter: Payer: Self-pay | Admitting: Physician Assistant

## 2021-10-16 DIAGNOSIS — R7989 Other specified abnormal findings of blood chemistry: Secondary | ICD-10-CM

## 2021-11-05 ENCOUNTER — Other Ambulatory Visit (INDEPENDENT_AMBULATORY_CARE_PROVIDER_SITE_OTHER): Payer: 59

## 2021-11-05 DIAGNOSIS — R7989 Other specified abnormal findings of blood chemistry: Secondary | ICD-10-CM | POA: Diagnosis not present

## 2021-11-05 LAB — T4, FREE: Free T4: 0.72 ng/dL (ref 0.60–1.60)

## 2021-11-05 LAB — T3, FREE: T3, Free: 2.5 pg/mL (ref 2.3–4.2)

## 2021-11-05 LAB — TSH: TSH: 6.07 u[IU]/mL — ABNORMAL HIGH (ref 0.35–5.50)

## 2021-11-18 ENCOUNTER — Encounter: Payer: Self-pay | Admitting: *Deleted

## 2021-12-04 ENCOUNTER — Encounter: Payer: Self-pay | Admitting: Physician Assistant

## 2022-01-08 ENCOUNTER — Encounter: Payer: Self-pay | Admitting: Physician Assistant

## 2022-01-08 DIAGNOSIS — R7989 Other specified abnormal findings of blood chemistry: Secondary | ICD-10-CM

## 2022-01-08 NOTE — Telephone Encounter (Signed)
Katelyn Velazquez, pt would like thyroid orders for Jan. Looks like a referral was done to Endo, but pt never scheduled. Do you want to see her in January or just lab appt only. Please advise

## 2022-02-06 ENCOUNTER — Encounter: Payer: Self-pay | Admitting: *Deleted

## 2022-02-07 ENCOUNTER — Ambulatory Visit
Admission: RE | Admit: 2022-02-07 | Discharge: 2022-02-07 | Disposition: A | Discharge status: Home / Self Care | Payer: 59 | Source: Ambulatory Visit | Attending: Registered Nurse | Admitting: Registered Nurse

## 2022-02-07 DIAGNOSIS — M810 Age-related osteoporosis without current pathological fracture: Secondary | ICD-10-CM

## 2022-02-26 ENCOUNTER — Other Ambulatory Visit (INDEPENDENT_AMBULATORY_CARE_PROVIDER_SITE_OTHER): Payer: 59

## 2022-02-26 DIAGNOSIS — R7989 Other specified abnormal findings of blood chemistry: Secondary | ICD-10-CM | POA: Diagnosis not present

## 2022-02-26 LAB — T4, FREE: Free T4: 0.82 ng/dL (ref 0.60–1.60)

## 2022-02-26 LAB — T3, FREE: T3, Free: 2.9 pg/mL (ref 2.3–4.2)

## 2022-02-26 LAB — TSH: TSH: 3.73 u[IU]/mL (ref 0.35–5.50)

## 2022-03-25 LAB — HM MAMMOGRAPHY

## 2022-06-26 ENCOUNTER — Ambulatory Visit (INDEPENDENT_AMBULATORY_CARE_PROVIDER_SITE_OTHER): Payer: 59 | Admitting: Student

## 2022-06-26 ENCOUNTER — Other Ambulatory Visit (HOSPITAL_BASED_OUTPATIENT_CLINIC_OR_DEPARTMENT_OTHER): Payer: Self-pay

## 2022-06-26 ENCOUNTER — Ambulatory Visit (INDEPENDENT_AMBULATORY_CARE_PROVIDER_SITE_OTHER): Payer: 59

## 2022-06-26 DIAGNOSIS — M7541 Impingement syndrome of right shoulder: Secondary | ICD-10-CM | POA: Insufficient documentation

## 2022-06-26 DIAGNOSIS — M25512 Pain in left shoulder: Secondary | ICD-10-CM

## 2022-06-26 DIAGNOSIS — G8929 Other chronic pain: Secondary | ICD-10-CM | POA: Diagnosis not present

## 2022-06-26 MED ORDER — MELOXICAM 15 MG PO TABS
15.0000 mg | ORAL_TABLET | Freq: Every day | ORAL | 0 refills | Status: AC
  Filled 2022-06-26: qty 10, 10d supply, fill #0

## 2022-06-26 NOTE — Progress Notes (Signed)
Chief Complaint: Right shoulder pain     History of Present Illness:    Contessa Preuss is a 57 y.o. female presenting with right shoulder pain that began about a week ago.  She first noticed it while reaching overhead to grab something but does not recall a specific injury.  She states the pain is very minimal at rest however increases to moderate with certain movements of the shoulder.  She is unable to lay on her right side and it occasionally wakes her up at night.  She feels like her pain is limiting her range of motion.  Has not taken any pain medications.  Denies neck pain, numbness, and tingling.   Surgical History:   None  PMH/PSH/Family History/Social History/Meds/Allergies:    Past Medical History:  Diagnosis Date   Allergy    Arthritis    Family history of Moyamoya    Hyperlipidemia    Thyroid disease    Past Surgical History:  Procedure Laterality Date   BREAST BIOPSY     CESAREAN SECTION  1999   KNEE ARTHROSCOPY  2018, 2019   Social History   Socioeconomic History   Marital status: Married    Spouse name: Not on file   Number of children: 1   Years of education: 16   Highest education level: Not on file  Occupational History   Occupation: [employer]  Tobacco Use   Smoking status: Never   Smokeless tobacco: Never  Vaping Use   Vaping Use: Never used  Substance and Sexual Activity   Alcohol use: No   Drug use: No   Sexual activity: Yes    Birth control/protection: Other-see comments    Comment: vasectomy  Other Topics Concern   Not on file  Social History Narrative   Is an Actuarian   Lives at home w/ her husband   Daughter is 33 (2023)   Social Determinants of Corporate investment banker Strain: Not on file  Food Insecurity: Not on file  Transportation Needs: Not on file  Physical Activity: Not on file  Stress: Not on file  Social Connections: Not on file   Family History  Problem Relation Age of Onset    Hypertension Mother    Parkinsonism Mother    Arthritis Mother    Cancer Father        prostate   Stroke Sister 8       brain anersym    Early death Sister    Cancer Brother        prostate   Alcohol abuse Brother    Depression Brother    Hyperlipidemia Brother    Prostate cancer Brother    Colon cancer Neg Hx    Esophageal cancer Neg Hx    Rectal cancer Neg Hx    Stomach cancer Neg Hx    Allergies  Allergen Reactions   Latex Itching   Bacitracin Rash   Bacitracin-Polymyxin B Rash   Current Outpatient Medications  Medication Sig Dispense Refill   meloxicam (MOBIC) 15 MG tablet Take 1 tablet (15 mg total) by mouth daily for 10 days. 10 tablet 0   alendronate (FOSAMAX) 70 MG tablet Take 1 tablet (70 mg total) by mouth every 7 (seven) days. Take with a full glass of water on an empty stomach. 12 tablet 4  ARMOUR THYROID 30 MG tablet Take 30 mg by mouth every morning.     atorvastatin (LIPITOR) 10 MG tablet Take 1 tablet (10 mg total) by mouth daily. 90 tablet 3   BINAXNOW COVID-19 AG HOME TEST KIT FOLLOW INSTRUCTIONS INCLUDED WITH THE PACKAGE.     Cholecalciferol (VITAMIN D) 125 MCG (5000 UT) CAPS Take 1 capsule by mouth daily in the afternoon.     clobetasol (TEMOVATE) 0.05 % external solution 1 application Externally Twice a day for 10 day(s)     desonide (DESONATE) 0.05 % gel 1 application to affected area Externally Twice a day for 2 weeks     estradiol (VIVELLE-DOT) 0.0375 MG/24HR Place onto the skin.     Omega-3 Fatty Acids (FISH OIL PO) Take by mouth.     progesterone (PROMETRIUM) 100 MG capsule Take 100 mg by mouth at bedtime.     triamcinolone cream (KENALOG) 0.1 % 1 application topically BID for 14 days     VITAMIN D PO Take by mouth.     No current facility-administered medications for this visit.   No results found.  Review of Systems:   A ROS was performed including pertinent positives and negatives as documented in the HPI.  Physical Exam :    Constitutional: NAD and appears stated age Neurological: Alert and oriented Psych: Appropriate affect and cooperative Last menstrual period 08/12/2016.   Comprehensive Musculoskeletal Exam:    Musculoskeletal Exam    Inspection Right Left  Skin No atrophy or winging No atrophy or winging  Palpation    Tenderness Lateral shoulder None  Range of Motion    Flexion (passive) 170 170  Flexion (active) 170 170  Abduction 170 170  ER at the side 70 70  Can reach behind back to T12 T12  Strength     Full Full  Special Tests    Pseudoparalytic No No  Neurologic    Fires PIN, radial, median, ulnar, musculocutaneous, axillary, suprascapular, long thoracic, and spinal accessory innervated muscles. No abnormal sensibility  Vascular/Lymphatic    Radial Pulse 2+ 2+  Special Test: Positive Hawkins and painful arc. Negative empty can, O'Brien, Speeds, and belly press.     Imaging:   Xray (3 views right shoulder): Normal  I personally reviewed and interpreted the radiographs.   Assessment:   57 y.o. female presents with atraumatic right shoulder pain.  I believe her symptoms and exam are consistent with shoulder impingement.  Overall her strength levels are full and equal with bilateral shoulders.  After discussion of possible conservative treatment options, patient would like to proceed with trial of anti-inflammatories and rotator cuff home exercise program.  Discussed that if she does not have improvement in symptoms after 4 weeks, that we can really evaluate and consider possible subacromial cortisone injection at that time.  Patient has had previous physical therapy for her left shoulder and would like to perform exercises at home if possible.  Plan :    -Return to clinic in 4 weeks if no improvement     I personally saw and evaluated the patient, and participated in the management and treatment plan.  Hazle Nordmann, PA-C Orthopedics  This document was dictated using  Conservation officer, historic buildings. A reasonable attempt at proof reading has been made to minimize errors.

## 2022-07-23 ENCOUNTER — Ambulatory Visit (INDEPENDENT_AMBULATORY_CARE_PROVIDER_SITE_OTHER): Payer: 59 | Admitting: Orthopaedic Surgery

## 2022-07-23 DIAGNOSIS — M7541 Impingement syndrome of right shoulder: Secondary | ICD-10-CM

## 2022-07-23 NOTE — Progress Notes (Signed)
Chief Complaint: Shoulder pain     History of Present Illness:   07/23/2022: Presents today for follow-up of her right shoulder.  She has not been getting any relief from Mobic.  At this time she is here today for further discussion as she has persistent pain with overhead activity with clicking and popping.  Katelyn Velazquez is a 57 y.o. female right-hand-dominant female presents with left shoulder pain has been ongoing now for several months.  She describes as a burning sharp pain in the upper trapezius.  She does not have any pain with laying directly on that side.  She is very active and enjoys doing light aerobics and yoga.  She says that this flares up after yoga.  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  She does use CBD oil which helps.  Denies any changes in range of motion.  She has full sensation bilateral upper extremities    Surgical History:   None  PMH/PSH/Family History/Social History/Meds/Allergies:    Past Medical History:  Diagnosis Date   Allergy    Arthritis    Family history of Moyamoya    Hyperlipidemia    Thyroid disease    Past Surgical History:  Procedure Laterality Date   BREAST BIOPSY     CESAREAN SECTION  1999   KNEE ARTHROSCOPY  2018, 2019   Social History   Socioeconomic History   Marital status: Married    Spouse name: Not on file   Number of children: 1   Years of education: 16   Highest education level: Not on file  Occupational History   Occupation: [employer]  Tobacco Use   Smoking status: Never   Smokeless tobacco: Never  Vaping Use   Vaping Use: Never used  Substance and Sexual Activity   Alcohol use: No   Drug use: No   Sexual activity: Yes    Birth control/protection: Other-see comments    Comment: vasectomy  Other Topics Concern   Not on file  Social History Narrative   Is an Actuarian   Lives at home w/ her husband   Daughter is 31 (2023)    Social Determinants of Corporate investment banker Strain: Not on file  Food Insecurity: Not on file  Transportation Needs: Not on file  Physical Activity: Not on file  Stress: Not on file  Social Connections: Not on file   Family History  Problem Relation Age of Onset   Hypertension Mother    Parkinsonism Mother    Arthritis Mother    Cancer Father        prostate   Stroke Sister 58       brain anersym    Early death Sister    Cancer Brother        prostate   Alcohol abuse Brother    Depression Brother    Hyperlipidemia Brother    Prostate cancer Brother    Colon cancer Neg Hx    Esophageal cancer Neg Hx    Rectal cancer Neg Hx    Stomach cancer Neg Hx    Allergies  Allergen Reactions   Latex Itching   Bacitracin Rash   Bacitracin-Polymyxin B Rash   Current Outpatient Medications  Medication Sig Dispense Refill   alendronate (FOSAMAX) 70  MG tablet Take 1 tablet (70 mg total) by mouth every 7 (seven) days. Take with a full glass of water on an empty stomach. 12 tablet 4   ARMOUR THYROID 30 MG tablet Take 30 mg by mouth every morning.     atorvastatin (LIPITOR) 10 MG tablet Take 1 tablet (10 mg total) by mouth daily. 90 tablet 3   BINAXNOW COVID-19 AG HOME TEST KIT FOLLOW INSTRUCTIONS INCLUDED WITH THE PACKAGE.     Cholecalciferol (VITAMIN D) 125 MCG (5000 UT) CAPS Take 1 capsule by mouth daily in the afternoon.     clobetasol (TEMOVATE) 0.05 % external solution 1 application Externally Twice a day for 10 day(s)     desonide (DESONATE) 0.05 % gel 1 application to affected area Externally Twice a day for 2 weeks     estradiol (VIVELLE-DOT) 0.0375 MG/24HR Place onto the skin.     Omega-3 Fatty Acids (FISH OIL PO) Take by mouth.     progesterone (PROMETRIUM) 100 MG capsule Take 100 mg by mouth at bedtime.     triamcinolone cream (KENALOG) 0.1 % 1 application topically BID for 14 days     VITAMIN D PO Take by mouth.     No current facility-administered medications  for this visit.   No results found.  Review of Systems:   A ROS was performed including pertinent positives and negatives as documented in the HPI.  Physical Exam :   Constitutional: NAD and appears stated age Neurological: Alert and oriented Psych: Appropriate affect and cooperative Last menstrual period 08/12/2016.   Comprehensive Musculoskeletal Exam:    Musculoskeletal Exam    Inspection Right Left  Skin No atrophy or winging Positive leaning on the left  Palpation    Tenderness Lateral deltoid None  Range of Motion    Flexion (passive) 170 170  Flexion (active) 170 170  Abduction 170 170  ER at the side 70 70  Can reach behind back to T12 T12  Strength     Full Full  Special Tests    Pseudoparalytic No No  Neurologic    Fires PIN, radial, median, ulnar, musculocutaneous, axillary, suprascapular, long thoracic, and spinal accessory innervated muscles. No abnormal sensibility  Vascular/Lymphatic    Radial Pulse 2+ 2+  Cervical Exam    Patient has symmetric cervical range of motion with negative Spurling's test.  Special Test: None   Positive Neer impingement on the right  Imaging:   Xray (3 views left shoulder): Normal  I personally reviewed and interpreted the radiographs.   Assessment:   57 y.o. female presents with right shoulder pain consistent with rotator cuff tendinitis and biceps tendinopathy.  Today's visit we did discuss the possible causes of her shoulder pain.  At this time she has trialed physical therapy with persistent right shoulder pain.  She has trialed activity restriction as well as a course of NSAIDs.  None of this are responding.  Given this we will plan for an MRI of the right shoulder I will see her back to discuss results  Plan :    -Plan for MRI right shoulder and follow-up to discuss results     I personally saw and evaluated the patient, and participated in the management and treatment plan.  Huel Cote, MD Attending  Physician, Orthopedic Surgery  This document was dictated using Dragon voice recognition software. A reasonable attempt at proof reading has been made to minimize errors.

## 2022-07-30 ENCOUNTER — Encounter (HOSPITAL_BASED_OUTPATIENT_CLINIC_OR_DEPARTMENT_OTHER): Payer: Self-pay | Admitting: Orthopaedic Surgery

## 2022-08-03 ENCOUNTER — Ambulatory Visit
Admission: RE | Admit: 2022-08-03 | Discharge: 2022-08-03 | Disposition: A | Discharge status: Home / Self Care | Payer: 59 | Source: Ambulatory Visit | Attending: Orthopaedic Surgery | Admitting: Orthopaedic Surgery

## 2022-08-03 DIAGNOSIS — M7541 Impingement syndrome of right shoulder: Secondary | ICD-10-CM

## 2022-08-06 ENCOUNTER — Encounter: Payer: 59 | Admitting: Registered Nurse

## 2022-08-08 ENCOUNTER — Ambulatory Visit (HOSPITAL_BASED_OUTPATIENT_CLINIC_OR_DEPARTMENT_OTHER): Payer: 59 | Admitting: Orthopaedic Surgery

## 2022-08-12 ENCOUNTER — Ambulatory Visit (INDEPENDENT_AMBULATORY_CARE_PROVIDER_SITE_OTHER): Payer: 59 | Admitting: Physician Assistant

## 2022-08-12 ENCOUNTER — Encounter: Payer: Self-pay | Admitting: Physician Assistant

## 2022-08-12 VITALS — BP 124/70 | Temp 97.9°F | Ht 61.0 in | Wt 97.2 lb

## 2022-08-12 DIAGNOSIS — Z0000 Encounter for general adult medical examination without abnormal findings: Secondary | ICD-10-CM | POA: Diagnosis not present

## 2022-08-12 DIAGNOSIS — E785 Hyperlipidemia, unspecified: Secondary | ICD-10-CM

## 2022-08-12 DIAGNOSIS — M810 Age-related osteoporosis without current pathological fracture: Secondary | ICD-10-CM

## 2022-08-12 DIAGNOSIS — E88819 Insulin resistance, unspecified: Secondary | ICD-10-CM

## 2022-08-12 DIAGNOSIS — R7989 Other specified abnormal findings of blood chemistry: Secondary | ICD-10-CM | POA: Diagnosis not present

## 2022-08-12 DIAGNOSIS — M25562 Pain in left knee: Secondary | ICD-10-CM

## 2022-08-12 DIAGNOSIS — G8929 Other chronic pain: Secondary | ICD-10-CM

## 2022-08-12 LAB — COMPREHENSIVE METABOLIC PANEL
ALT: 13 U/L (ref 0–35)
AST: 19 U/L (ref 0–37)
Albumin: 4.7 g/dL (ref 3.5–5.2)
Alkaline Phosphatase: 32 U/L — ABNORMAL LOW (ref 39–117)
BUN: 15 mg/dL (ref 6–23)
CO2: 27 mEq/L (ref 19–32)
Calcium: 9.3 mg/dL (ref 8.4–10.5)
Chloride: 98 mEq/L (ref 96–112)
Creatinine, Ser: 0.78 mg/dL (ref 0.40–1.20)
GFR: 83.56 mL/min (ref >60.00)
Glucose, Bld: 85 mg/dL (ref 70–99)
Potassium: 4.6 mEq/L (ref 3.5–5.1)
Sodium: 134 mEq/L — ABNORMAL LOW (ref 135–145)
Total Bilirubin: 0.7 mg/dL (ref 0.2–1.2)
Total Protein: 7.5 g/dL (ref 6.0–8.3)

## 2022-08-12 LAB — LIPID PANEL
Cholesterol: 203 mg/dL — ABNORMAL HIGH (ref 0–200)
HDL: 81 mg/dL (ref >39.00)
LDL Cholesterol: 113 mg/dL — ABNORMAL HIGH (ref 0–99)
NonHDL: 121.56
Total CHOL/HDL Ratio: 3
Triglycerides: 45 mg/dL (ref 0.0–149.0)
VLDL: 9 mg/dL (ref 0.0–40.0)

## 2022-08-12 LAB — TSH: TSH: 3.62 u[IU]/mL (ref 0.35–5.50)

## 2022-08-12 LAB — CBC
HCT: 40.6 % (ref 36.0–46.0)
Hemoglobin: 13.5 g/dL (ref 12.0–15.0)
MCHC: 33.2 g/dL (ref 30.0–36.0)
MCV: 100.7 fl — ABNORMAL HIGH (ref 78.0–100.0)
Platelets: 195 10*3/uL (ref 150.0–400.0)
RBC: 4.03 Mil/uL (ref 3.87–5.11)
RDW: 12.4 % (ref 11.5–15.5)
WBC: 2.7 10*3/uL — ABNORMAL LOW (ref 4.0–10.5)

## 2022-08-12 LAB — T3, FREE: T3, Free: 2.6 pg/mL (ref 2.3–4.2)

## 2022-08-12 LAB — HEMOGLOBIN A1C: Hgb A1c MFr Bld: 5.6 % (ref 4.6–6.5)

## 2022-08-12 LAB — T4, FREE: Free T4: 0.97 ng/dL (ref 0.60–1.60)

## 2022-08-12 NOTE — Progress Notes (Signed)
Subjective:    Katelyn Velazquez is a 57 y.o. female and is here for a comprehensive physical exam.  HPI  Health Maintenance Due  Topic Date Due   HIV Screening  Never done   COVID-19 Vaccine (6 - 2023-24 season) 10/25/2021   PAP SMEAR-Modifier  07/26/2022    Acute Concerns: None  Chronic Issues: Knee pain/swelling: She complains of left knee pain and swelling due to scar tissue from prior knee arthroscopies in 2018 and 2019.  Aside from regular activities, she has been unable to walk more than a mile.  She is not interested in a knee replacement due to more scar tissue worsening her pain and swelling   Hyperlipidemia: She has been compliant with 10 mg Atorvastatin.  She is interested in decreasing her dose.  Lab Results  Component Value Date   CHOL 171 07/31/2021   HDL 83.50 07/31/2021   LDLCALC 81 07/31/2021   TRIG 30.0 07/31/2021   CHOLHDL 2 07/31/2021   Abnormal TSH She was on armour thyroid but recently stopped this She is feeling well and would like thyroid panel checked today  Health Maintenance: Immunizations -- UTD Colonoscopy -- Done 02/12/2015. Results were normal.  Mammogram -- UTD 03/25/2022. Results were normal.  PAP -- Hx of abnormal pap. Last pap in 2021. Next is due, she states she has an appointment with gyn next month.  Bone Density -- UTD 02/07/2022. Within osteopenia range.  Diet -- Overall healthy Exercise -- Strength training with dumbbells. Walking is limited due to knee scar tissue.   Sleep habits -- No concerns. Mood -- Stable  UTD with dentist? - yes UTD with eye doctor? - yes  Weight history: Wt Readings from Last 10 Encounters:  08/12/22 97 lb 3.2 oz (44.1 kg)  09/23/21 93 lb (42.2 kg)  08/07/21 95 lb (43.1 kg)  07/31/21 97 lb 6.4 oz (44.2 kg)  12/07/20 97 lb 6.4 oz (44.2 kg)  08/06/20 97 lb (44 kg)  06/07/20 99 lb 6.4 oz (45.1 kg)  08/23/19 94 lb 3.2 oz (42.7 kg)  07/26/19 96 lb (43.5 kg)  07/15/19 94 lb (42.6 kg)   Body  mass index is 18.37 kg/m. Patient's last menstrual period was 08/12/2016 (lmp unknown).  Alcohol use:  reports no history of alcohol use.  Tobacco use:  Tobacco Use: Low Risk  (08/12/2022)   Patient History    Smoking Tobacco Use: Never    Smokeless Tobacco Use: Never    Passive Exposure: Not on file   Eligible for lung cancer screening? no     08/12/2022    8:54 AM  Depression screen PHQ 2/9  Decreased Interest 0  Down, Depressed, Hopeless 0  PHQ - 2 Score 0  Altered sleeping 0  Tired, decreased energy 0  Change in appetite 0  Feeling bad or failure about yourself  0  Trouble concentrating 0  Moving slowly or fidgety/restless 0  Suicidal thoughts 0  PHQ-9 Score 0     Other providers/specialists: Patient Care Team: Jarold Motto, [name] as PCP - General (Physician Assistant) Olivia Mackie, NP as Nurse Practitioner (Gynecology)    PMHx, SurgHx, SocialHx, Medications, and Allergies were reviewed in the Visit Navigator and updated as appropriate.   Past Medical History:  Diagnosis Date   Allergy    Arthritis    Family history of Moyamoya    Hyperlipidemia    Thyroid disease      Past Surgical History:  Procedure Laterality Date   BREAST BIOPSY  CESAREAN SECTION  1999   KNEE ARTHROSCOPY  2018, 2019     Family History  Problem Relation Age of Onset   Hypertension Mother    Parkinsonism Mother    Arthritis Mother    Cancer Father        prostate   Stroke Sister 46       brain anersym    Early death Sister    Cancer Brother        prostate   Alcohol abuse Brother    Depression Brother    Hyperlipidemia Brother    Hyperlipidemia Brother    Prostate cancer Brother    Cancer Brother    Colon cancer Neg Hx    Esophageal cancer Neg Hx    Rectal cancer Neg Hx    Stomach cancer Neg Hx     Social History   Tobacco Use   Smoking status: Never   Smokeless tobacco: Never  Vaping Use   Vaping Use: Never used  Substance Use Topics   Alcohol  use: No   Drug use: No    Review of Systems:   Review of Systems  Constitutional:  Negative for chills, fever, malaise/fatigue and weight loss.  HENT:  Negative for hearing loss, sinus pain and sore throat.   Respiratory:  Negative for cough and hemoptysis.   Cardiovascular:  Negative for chest pain, palpitations, leg swelling and PND.  Gastrointestinal:  Negative for abdominal pain, constipation, diarrhea, heartburn, nausea and vomiting.  Genitourinary:  Negative for dysuria, frequency and urgency.  Musculoskeletal:  Positive for joint pain (left knee -- see HPIsad). Negative for back pain, myalgias and neck pain.  Skin:  Negative for itching and rash.  Neurological:  Negative for dizziness, tingling, seizures and headaches.  Endo/Heme/Allergies:  Negative for polydipsia.  Psychiatric/Behavioral:  Negative for depression. The patient is not nervous/anxious.     Objective:   BP 124/70   Temp 97.9 F (36.6 C) (Temporal)   Ht 5\' 1"  (1.549 m)   Wt 97 lb 3.2 oz (44.1 kg)   LMP 08/12/2016 (LMP Unknown)   BMI 18.37 kg/m  Body mass index is 18.37 kg/m.   General Appearance:    Alert, cooperative, no distress, appears stated age  Head:    Normocephalic, without obvious abnormality, atraumatic  Eyes:    PERRL, conjunctiva/corneas clear, EOM's intact, fundi    benign, both eyes  Ears:    Normal TM's and external ear canals, both ears  Nose:   Nares normal, septum midline, mucosa normal, no drainage    or sinus tenderness  Throat:   Lips, mucosa, and tongue normal; teeth and gums normal  Neck:   Supple, symmetrical, trachea midline, no adenopathy;    thyroid:  no enlargement/tenderness/nodules; no carotid   bruit or JVD  Back:     Symmetric, no curvature, ROM normal, no CVA tenderness  Lungs:     Clear to auscultation bilaterally, respirations unlabored  Chest Wall:    No tenderness or deformity   Heart:    Regular rate and rhythm, S1 and S2 normal, no murmur, rub or gallop   Breast Exam:    Deferred  Abdomen:     Soft, non-tender, bowel sounds active all four quadrants,    no masses, no organomegaly  Genitalia:    Deferred   Extremities:   Extremities normal, atraumatic, no cyanosis or edema  Pulses:   2+ and symmetric all extremities  Skin:   Skin color, texture, turgor normal, no rashes  or lesions  Lymph nodes:   Cervical, supraclavicular, and axillary nodes normal  Neurologic:   CNII-XII intact, normal strength, sensation and reflexes    throughout    Assessment/Plan:   Routine physical examination Today patient counseled on age appropriate routine health concerns for screening and prevention, each reviewed and up to date or declined. Immunizations reviewed and up to date or declined. Labs ordered and reviewed. Risk factors for depression reviewed and negative. Hearing function and visual acuity are intact. ADLs screened and addressed as needed. Functioal ability and level of safety reviewed and appropriate. Education, counseling and referrals performed based on assessed risks today. Patient provided with a copy of personalized plan for preventive services.  Left knee pain Reviewed She is overall managing well Denies new/worsening symptom(s)  Has orthopedist on board if needed  Low TSH level Update thyroid panel and advise accordinly  Hyperlipidemia, unspecified hyperlipidemia type Update lipid panel She is interested in de-prescribing -- discussed either cutting Lipitor 10 mg in half or trying lower potency statin -- she would like to try reduced dose of Lipitor I asked her to let me know if she has problems with this medication(s)   I,Rachel Rivera,acting as a scribe for [employer], PA.,have documented all relevant documentation on the behalf of Jarold Motto, PA,as directed by  Jarold Motto, PA while in the presence of Jarold Motto, [name].  I, Jarold Motto, [name], have reviewed all documentation for this visit. The documentation on  08/12/22 for the exam, diagnosis, procedures, and orders are all accurate and complete. '  Jarold Motto, PA-C [facility]

## 2022-08-12 NOTE — Patient Instructions (Addendum)
It was great to see you!  Cut your Lipitor in half at 5 mg and see if this works for you.  Please go to the lab for blood work.   Our office will call you with your results unless you have chosen to receive results via MyChart.  If your blood work is normal we will follow-up each year for physicals and as scheduled for chronic medical problems.  If anything is abnormal we will treat accordingly and get you in for a follow-up.  Take care,  Lelon Mast

## 2022-08-13 ENCOUNTER — Encounter: Payer: Self-pay | Admitting: Physician Assistant

## 2022-08-13 NOTE — Telephone Encounter (Signed)
Please see pt response to labs and advise if anything further is needed

## 2022-08-20 ENCOUNTER — Ambulatory Visit (HOSPITAL_BASED_OUTPATIENT_CLINIC_OR_DEPARTMENT_OTHER): Payer: 59 | Admitting: Orthopaedic Surgery

## 2022-08-20 ENCOUNTER — Ambulatory Visit (INDEPENDENT_AMBULATORY_CARE_PROVIDER_SITE_OTHER): Payer: 59 | Admitting: Orthopaedic Surgery

## 2022-08-20 ENCOUNTER — Encounter: Payer: Self-pay | Admitting: Physical Therapy

## 2022-08-20 ENCOUNTER — Ambulatory Visit (INDEPENDENT_AMBULATORY_CARE_PROVIDER_SITE_OTHER): Payer: 59 | Admitting: Physical Therapy

## 2022-08-20 DIAGNOSIS — M7541 Impingement syndrome of right shoulder: Secondary | ICD-10-CM

## 2022-08-20 DIAGNOSIS — M25511 Pain in right shoulder: Secondary | ICD-10-CM

## 2022-08-20 MED ORDER — LIDOCAINE HCL 1 % IJ SOLN
4.0000 mL | INTRAMUSCULAR | Status: AC | PRN
Start: 2022-08-20 — End: 2022-08-20
  Administered 2022-08-20: 4 mL

## 2022-08-20 MED ORDER — TRIAMCINOLONE ACETONIDE 40 MG/ML IJ SUSP
80.0000 mg | INTRAMUSCULAR | Status: AC | PRN
Start: 2022-08-20 — End: 2022-08-20
  Administered 2022-08-20: 80 mg via INTRA_ARTICULAR

## 2022-08-20 NOTE — Therapy (Signed)
  OUTPATIENT PHYSICAL THERAPY SCREEN @[address]   Patient Name: Katelyn Velazquez MRN: [id number] DOB:03-15-1965, 57 y.o., female Today's Date: 08/20/2022  END OF SESSION:  PT End of Session - 08/20/22 0853     Visit Number 1    Activity Tolerance Patient tolerated treatment well    Behavior During Therapy [facility] for tasks assessed/performed             Past Medical History:  Diagnosis Date   Allergy    Arthritis    Family history of Moyamoya    Hyperlipidemia    Thyroid disease    Past Surgical History:  Procedure Laterality Date   BREAST BIOPSY     CESAREAN SECTION  1999   KNEE ARTHROSCOPY  2018, 2019   Patient Active Problem List   Diagnosis Date Noted   Shoulder impingement syndrome, right 06/26/2022   Hyperlipidemia 09/28/2019   Osteoporosis without current pathological fracture 08/23/2019   Primary osteoarthritis involving multiple joints 08/23/2019   Lateral epicondylitis of left elbow 01/26/2019   Lateral epicondylitis of right elbow 01/26/2019   Trigger finger of left thumb 01/26/2019   Trigger middle finger of left hand 10/08/2016    moyamoya family history 01/31/2016   Idiopathic scoliosis 07/01/2013     THERAPY DIAG:  Acute pain of right shoulder  Goal of screen:  This patient was referred to Physical Therapy specialty screen by Huel Cote, MD for HEP to address shoulder impingement in overhead motion due to subacromial spur.   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 Will focus on core engagement with overhead motions and work on exercises. If she cont to have pain at 6wk f/u would benefit from scoliosis program.    Army Fossa PT, DPT 08/20/2022, 8:56 AM  [address] [place], [place] [address] [phone]   Note: charges not applied for screen.

## 2022-08-20 NOTE — Progress Notes (Signed)
Chief Complaint: Shoulder pain     History of Present Illness:   08/20/2022: Presents today for follow-up of her right shoulder.  She states that this is getting slightly worse today.  She is having deep aching pain when lying directly on the shoulder.  Katelyn Velazquez is a 57 y.o. female right-hand-dominant female presents with left shoulder pain has been ongoing now for several months.  She describes as a burning sharp pain in the upper trapezius.  She does not have any pain with laying directly on that side.  She is very active and enjoys doing light aerobics and yoga.  She says that this flares up after yoga.  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  She does use CBD oil which helps.  Denies any changes in range of motion.  She has full sensation bilateral upper extremities    Surgical History:   None  PMH/PSH/Family History/Social History/Meds/Allergies:    Past Medical History:  Diagnosis Date  . Allergy   . Arthritis   . Family history of Moyamoya   . Hyperlipidemia   . Thyroid disease    Past Surgical History:  Procedure Laterality Date  . BREAST BIOPSY    . CESAREAN SECTION  1999  . KNEE ARTHROSCOPY  2018, 2019   Social History   Socioeconomic History  . Marital status: Married    Spouse name: Not on file  . Number of children: 1  . Years of education: 57  . Highest education level: Not on file  Occupational History  . Occupation: [employer]  Tobacco Use  . Smoking status: Never  . Smokeless tobacco: Never  Vaping Use  . Vaping Use: Never used  Substance and Sexual Activity  . Alcohol use: No  . Drug use: No  . Sexual activity: Yes    Birth control/protection: Post-menopausal, Other-see comments    Comment: vasectomy  Other Topics Concern  . Not on file  Social History Narrative   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   Daughter is 70 (2023)   Social Determinants  of Corporate investment banker Strain: Not on file  Food Insecurity: Not on file  Transportation Needs: Not on file  Physical Activity: Not on file  Stress: Not on file  Social Connections: Not on file   Family History  Problem Relation Age of Onset  . Hypertension Mother   . Parkinsonism Mother   . Arthritis Mother   . Cancer Father        prostate  . Stroke Sister 10       brain anersym   . Early death Sister   . Cancer Brother        prostate  . Alcohol abuse Brother   . Depression Brother   . Hyperlipidemia Brother   . Hyperlipidemia Brother   . Prostate cancer Brother   . Cancer Brother   . Colon cancer Neg Hx   . Esophageal cancer Neg Hx   . Rectal cancer Neg Hx   . Stomach cancer Neg Hx    Allergies  Allergen Reactions  . Latex Itching  . Bacitracin Rash  . Bacitracin-Polymyxin B Rash   Current Outpatient Medications  Medication Sig Dispense Refill  . alendronate (FOSAMAX)  70 MG tablet Take 1 tablet (70 mg total) by mouth every 7 (seven) days. Take with a full glass of water on an empty stomach. 12 tablet 4  . atorvastatin (LIPITOR) 10 MG tablet Take 1 tablet (10 mg total) by mouth daily. 90 tablet 3  . diclofenac Sodium (VOLTAREN) 1 % GEL Apply topically.    . fluticasone (FLONASE) 50 MCG/ACT nasal spray 1 spray in each nostril Nasally Once a day    . hydrocortisone 2.5 % ointment 1 application Externally Once a day    . Omega-3 Fatty Acids (FISH OIL PO) Take by mouth.    . QUERCETIN PO Take by mouth.    . triamcinolone cream (KENALOG) 0.1 % 1 application topically BID for 14 days     No current facility-administered medications for this visit.   No results found.  Review of Systems:   A ROS was performed including pertinent positives and negatives as documented in the HPI.  Physical Exam :   Constitutional: NAD and appears stated age Neurological: Alert and oriented Psych: Appropriate affect and cooperative Last menstrual period 08/12/2016.    Comprehensive Musculoskeletal Exam:    Musculoskeletal Exam    Inspection Right Left  Skin No atrophy or winging Positive leaning on the left  Palpation    Tenderness Lateral deltoid None  Range of Motion    Flexion (passive) 170 170  Flexion (active) 170 170  Abduction 170 170  ER at the side 70 70  Can reach behind back to T12 T12  Strength     Full Full  Special Tests    Pseudoparalytic No No  Neurologic    Fires PIN, radial, median, ulnar, musculocutaneous, axillary, suprascapular, long thoracic, and spinal accessory innervated muscles. No abnormal sensibility  Vascular/Lymphatic    Radial Pulse 2+ 2+  Cervical Exam    Patient has symmetric cervical range of motion with negative Spurling's test.  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   Assessment:   57 y.o. female presents with right shoulder pain consistent with rotator cuff tendinitis in the setting of subacromial impingement.  I did describe that this is typically a chronic phenomenon and may be related to her scapular winging as well.  At today's visit I recommended ultrasound-guided injection of the subacromial space along with an initial physical therapy session to help her work on her scapula.  I will plan to see her back in 6 weeks to reassess her progress  Plan :    -Return to clinic 6 weeks for reassessment    Procedure Note  Patient: Katelyn Velazquez             Date of Birth: March 11, 1965           MRN: [id number]             Visit Date: 08/20/2022  Procedures: Visit Diagnoses: No diagnosis found.  Large Joint Inj: R subacromial bursa on 08/20/2022 8:45 AM Indications: pain Details: 22 G 1.5 in needle, ultrasound-guided anterior  approach  Arthrogram: No  Medications: 4 mL lidocaine 1 %; 80 mg triamcinolone acetonide 40 MG/ML Outcome: tolerated well, no immediate complications Procedure, treatment alternatives, risks and benefits explained, specific risks discussed. Consent was given by the  patient. Immediately prior to procedure a time out was called to verify the correct patient, procedure, equipment, support staff and site/side marked as required. Patient was prepped and draped in the usual sterile fashion.          I personally saw and evaluated the patient, and participated in the management and treatment plan.  Huel Cote, MD Attending Physician, Orthopedic Surgery  This document was dictated using Dragon voice recognition software. A reasonable attempt at proof reading has been made to minimize errors.

## 2022-08-24 NOTE — Progress Notes (Unsigned)
Katelyn Velazquez December 12, 1965 [id number]   History:  57 y.o. G1P1 presents for annual exam. Postmenopausal. Was on HRT but ran out of prescriptions in April 2024. Was previously managed by [facility], started initially for trouble sleeping. Would like to restart for sleep and bone health. Did have spotting when she initially start HRT last time. Complains of painful intercourse. Normal pap and mammogram history. 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   Gynecologic History No LMP recorded. Patient is postmenopausal.   Contraception/Family planning: post menopausal status and vasectomy Sexually active: Yes  Health Maintenance Last Pap: 07/26/2019. Results were: Normal neg HPV, 5-year repeat Last mammogram: 03/25/2022. Results were: Normal Last colonoscopy: 02/12/2015. Results were: Benign polyp, 10-year recall Last Dexa: 02/07/2022. Results were: T-score -2.4 of spine (2.5 in 2022)  Past medical history, past surgical history, family history and social history were all reviewed and documented in the EPIC chart. 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   ROS:  A ROS was performed and pertinent positives and negatives are included.  Exam:  Vitals:   08/25/22 0900  BP: 122/74  Pulse: 69  SpO2: 100%  Weight: 95 lb (43.1 kg)  Height: 5\' 1"  (1.549 m)     Body mass index is 17.95 kg/m.  General appearance:  Normal Thyroid:  Symmetrical, normal in size, without palpable masses or nodularity. Respiratory  Auscultation:  Clear without wheezing or rhonchi Cardiovascular  Auscultation:  Regular rate, without rubs, murmurs or gallops  Edema/varicosities:  Not grossly evident Abdominal  Soft,nontender, without masses, guarding or rebound.  Liver/spleen:  No organomegaly noted  Hernia:  None appreciated  Skin  Inspection:  Grossly normal Breasts: Examined lying and  sitting.   Right: Without masses, retractions, nipple discharge or axillary adenopathy.   Left: Without masses, retractions, nipple discharge or axillary adenopathy. Genitourinary   Inguinal/mons:  Normal without inguinal adenopathy  External genitalia:  Normal appearing vulva with no masses, tenderness, or lesions  BUS/Urethra/Skene's glands:  Normal  Vagina:  Normal appearing with normal color and discharge, no lesions  Cervix:  Normal appearing without discharge or lesions  Uterus:  Normal in size, shape and contour.  Midline and mobile, nontender  Adnexa/parametria:     Rt: Normal in size, without masses or tenderness.   Lt: Normal in size, without masses or tenderness.  Anus and perineum: Normal  Digital rectal exam: Deferred  Patient informed chaperone available to be present for breast and pelvic exam. Patient has requested no chaperone to be present. Patient has been advised what will be completed during breast and pelvic exam.   Assessment/Plan:  57 y.o. G1P1 for annual exam.   Well female exam with routine gynecological exam - Education provided on SBEs, importance of preventative screenings, current guidelines, high calcium diet, regular exercise, and multivitamin daily. Labs with PCP.   Postmenopausal hormone therapy - Plan: estradiol (VIVELLE-DOT) 0.0375 MG/24HR twice weekly, progesterone (PROMETRIUM) 100 MG capsule nightly. Discussed risks of blood clots, heart attack, stroke, and breast cancer as well as the benefits of symptom management, bone, and heart health.   Age-related osteoporosis without current pathological fracture - Managed by PCP, on Fosamax since 07/2019. DXA 01/2022 - T-score -2.4 in spine (2.5 in 2022). Will also benefit from ERT.   Dyspareunia in female - s/t atrophy. Discussed option for vaginal estrogen. Oil or silicone lubricant recommended.   Screening for cervical cancer -  Normal Pap history.  Will repeat at 5-year interval per  guidelines.  Screening for breast cancer - Normal mammogram history.  Continue annual screenings.  Normal breast exam today.  Screening for colon cancer - 2016 colonoscopy. Will repeat at GI's recommended interval.   Return in 1 year for annual.      Olivia Mackie DNP, 9:30 AM 08/25/2022

## 2022-08-25 ENCOUNTER — Ambulatory Visit (INDEPENDENT_AMBULATORY_CARE_PROVIDER_SITE_OTHER): Payer: 59 | Admitting: Nurse Practitioner

## 2022-08-25 VITALS — BP 122/74 | HR 69 | Ht 61.0 in | Wt 95.0 lb

## 2022-08-25 DIAGNOSIS — Z79890 Hormone replacement therapy (postmenopausal): Secondary | ICD-10-CM

## 2022-08-25 DIAGNOSIS — N941 Unspecified dyspareunia: Secondary | ICD-10-CM | POA: Diagnosis not present

## 2022-08-25 DIAGNOSIS — M810 Age-related osteoporosis without current pathological fracture: Secondary | ICD-10-CM | POA: Diagnosis not present

## 2022-08-25 DIAGNOSIS — Z01419 Encounter for gynecological examination (general) (routine) without abnormal findings: Secondary | ICD-10-CM

## 2022-08-25 MED ORDER — PROGESTERONE MICRONIZED 100 MG PO CAPS
100.0000 mg | ORAL_CAPSULE | Freq: Every day | ORAL | 3 refills | Status: DC
Start: 2022-08-25 — End: 2023-07-30

## 2022-08-25 MED ORDER — ESTRADIOL 0.0375 MG/24HR TD PTTW
1.0000 | MEDICATED_PATCH | TRANSDERMAL | 3 refills | Status: DC
Start: 2022-08-25 — End: 2023-07-28

## 2022-09-09 ENCOUNTER — Other Ambulatory Visit: Payer: Self-pay | Admitting: *Deleted

## 2022-09-09 DIAGNOSIS — E785 Hyperlipidemia, unspecified: Secondary | ICD-10-CM

## 2022-09-09 MED ORDER — ATORVASTATIN CALCIUM 10 MG PO TABS
10.0000 mg | ORAL_TABLET | Freq: Every day | ORAL | 1 refills | Status: DC
Start: 2022-09-09 — End: 2023-03-05

## 2022-09-10 ENCOUNTER — Encounter: Payer: Self-pay | Admitting: Physician Assistant

## 2022-09-10 DIAGNOSIS — M810 Age-related osteoporosis without current pathological fracture: Secondary | ICD-10-CM

## 2022-09-10 MED ORDER — ALENDRONATE SODIUM 70 MG PO TABS
70.0000 mg | ORAL_TABLET | ORAL | 4 refills | Status: DC
Start: 2022-09-10 — End: 2023-10-05

## 2022-10-03 ENCOUNTER — Ambulatory Visit (HOSPITAL_BASED_OUTPATIENT_CLINIC_OR_DEPARTMENT_OTHER): Payer: 59 | Admitting: Orthopaedic Surgery

## 2022-10-09 ENCOUNTER — Ambulatory Visit (HOSPITAL_BASED_OUTPATIENT_CLINIC_OR_DEPARTMENT_OTHER): Payer: 59 | Admitting: Orthopaedic Surgery

## 2022-10-10 ENCOUNTER — Ambulatory Visit (HOSPITAL_BASED_OUTPATIENT_CLINIC_OR_DEPARTMENT_OTHER): Payer: 59 | Admitting: Orthopaedic Surgery

## 2022-10-13 ENCOUNTER — Ambulatory Visit (INDEPENDENT_AMBULATORY_CARE_PROVIDER_SITE_OTHER): Payer: 59 | Admitting: Orthopaedic Surgery

## 2022-10-13 DIAGNOSIS — M7541 Impingement syndrome of right shoulder: Secondary | ICD-10-CM

## 2022-10-13 NOTE — Progress Notes (Signed)
Chief Complaint: Shoulder pain     History of Present Illness:   10/13/2022: Presents today for follow-up of her right shoulder.  She states that she does feel back to normal after injection.  She is overall doing very well.  She is interested in shoulder exercises as well for maintenance.  Katelyn Velazquez is a 57 y.o. female right-hand-dominant female presents with left shoulder pain has been ongoing now for several months.  She describes as a burning sharp pain in the upper trapezius.  She does not have any pain with laying directly on that side.  She is very active and enjoys doing light aerobics and yoga.  She says that this flares up after yoga.  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  She does use CBD oil which helps.  Denies any changes in range of motion.  She has full sensation bilateral upper extremities    Surgical History:   None  PMH/PSH/Family History/Social History/Meds/Allergies:    Past Medical History:  Diagnosis Date   Allergy    Arthritis    Family history of Moyamoya    Hyperlipidemia    Thyroid disease    Past Surgical History:  Procedure Laterality Date   BREAST BIOPSY     CESAREAN SECTION  1999   KNEE ARTHROSCOPY  2018, 2019   Social History   Socioeconomic History   Marital status: Married    Spouse name: Not on file   Number of children: 1   Years of education: 16   Highest education level: Not on file  Occupational History   Occupation: [employer]  Tobacco Use   Smoking status: Never   Smokeless tobacco: Never  Vaping Use   Vaping status: Never Used  Substance and Sexual Activity   Alcohol use: No   Drug use: No   Sexual activity: Yes    Birth control/protection: Post-menopausal, Other-see comments    Comment: vasectomy  Other Topics Concern   Not on file  Social History Narrative   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   Daughter is 89 (2023)    Social Determinants of Corporate investment banker Strain: Not on file  Food Insecurity: Not on file  Transportation Needs: Not on file  Physical Activity: Not on file  Stress: Not on file  Social Connections: Not on file   Family History  Problem Relation Age of Onset   Hypertension Mother    Parkinsonism Mother    Arthritis Mother    Cancer Father        prostate   Stroke Sister 62       brain anersym    Early death Sister    Cancer Brother        prostate   Alcohol abuse Brother    Depression Brother    Hyperlipidemia Brother    Hyperlipidemia Brother    Prostate cancer Brother    Cancer Brother    Colon cancer Neg Hx    Esophageal cancer Neg Hx    Rectal cancer Neg Hx    Stomach cancer Neg Hx    Allergies  Allergen Reactions   Latex Itching   Bacitracin Rash   Bacitracin-Polymyxin B Rash   Current Outpatient Medications  Medication  Sig Dispense Refill   alendronate (FOSAMAX) 70 MG tablet Take 1 tablet (70 mg total) by mouth every 7 (seven) days. Take with a full glass of water on an empty stomach. 12 tablet 4   atorvastatin (LIPITOR) 10 MG tablet Take 1 tablet (10 mg total) by mouth daily. 90 tablet 1   diclofenac Sodium (VOLTAREN) 1 % GEL Apply topically.     estradiol (VIVELLE-DOT) 0.0375 MG/24HR Place 1 patch onto the skin 2 (two) times a week. 24 patch 3   fluticasone (FLONASE) 50 MCG/ACT nasal spray 1 spray in each nostril Nasally Once a day     hydrocortisone 2.5 % ointment 1 application Externally Once a day     Omega-3 Fatty Acids (FISH OIL PO) Take by mouth.     progesterone (PROMETRIUM) 100 MG capsule Take 1 capsule (100 mg total) by mouth daily. 90 capsule 3   QUERCETIN PO Take by mouth.     triamcinolone cream (KENALOG) 0.1 % 1 application topically BID for 14 days     No current facility-administered medications for this visit.   No results found.  Review of Systems:   A ROS was performed including pertinent positives and negatives as  documented in the HPI.  Physical Exam :   Constitutional: NAD and appears stated age Neurological: Alert and oriented Psych: Appropriate affect and cooperative Last menstrual period 08/12/2016.   Comprehensive Musculoskeletal Exam:    Musculoskeletal Exam    Inspection Right Left  Skin No atrophy or winging Positive leaning on the left  Palpation    Tenderness Lateral deltoid None  Range of Motion    Flexion (passive) 170 170  Flexion (active) 170 170  Abduction 170 170  ER at the side 70 70  Can reach behind back to T12 T12  Strength     Full Full  Special Tests    Pseudoparalytic No No  Neurologic    Fires PIN, radial, median, ulnar, musculocutaneous, axillary, suprascapular, long thoracic, and spinal accessory innervated muscles. No abnormal sensibility  Vascular/Lymphatic    Radial Pulse 2+ 2+  Cervical Exam    Patient has symmetric cervical range of motion with negative Spurling's test.  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   Assessment:   57 y.o. female presents with right shoulder pain consistent with rotator cuff tendinitis in the setting of subacromial impingement.  At this time she did get nearly complete relief from her subacromial injection.  I did show her a series of band exercises she may perform I will plan to see her back as needed  Plan :    -Return to clinic as needed      I personally saw and evaluated the patient, and participated in the management and treatment plan.  Huel Cote, MD Attending Physician, Orthopedic Surgery  This document was dictated using Dragon voice recognition software. A reasonable attempt at proof reading has been made to minimize errors.

## 2023-03-05 ENCOUNTER — Other Ambulatory Visit: Payer: Self-pay | Admitting: Physician Assistant

## 2023-03-05 DIAGNOSIS — E785 Hyperlipidemia, unspecified: Secondary | ICD-10-CM

## 2023-03-27 ENCOUNTER — Encounter: Payer: Self-pay | Admitting: Physician Assistant

## 2023-03-27 LAB — HM MAMMOGRAPHY

## 2023-04-28 ENCOUNTER — Encounter: Payer: Self-pay | Admitting: Physician Assistant

## 2023-04-28 DIAGNOSIS — M8580 Other specified disorders of bone density and structure, unspecified site: Secondary | ICD-10-CM

## 2023-04-28 NOTE — Telephone Encounter (Signed)
 Katelyn Velazquez, pt requesting to order Dexa scan prior to CPE in June, but last Dexa was 02/07/2022. Okay to order or too early?

## 2023-06-23 NOTE — Progress Notes (Signed)
 Katelyn Velazquez is a 58 y.o. female here for a {New prob or follow up:31724}.  History of Present Illness:   No chief complaint on file.  ***: Pt complains of intermittent pain in the right pubic area *** starting ***.  Her pain tends to worsen at nighttime ***.  Denies any recent trauma or injuries *** that may explain this pain.    ***   Past Medical History:  Diagnosis Date   Allergy    Arthritis    Family history of Moyamoya    Hyperlipidemia    Thyroid  disease      Social History   Tobacco Use   Smoking status: Never   Smokeless tobacco: Never  Vaping Use   Vaping status: Never Used  Substance Use Topics   Alcohol use: No   Drug use: No    Past Surgical History:  Procedure Laterality Date   BREAST BIOPSY     CESAREAN SECTION  1999   KNEE ARTHROSCOPY  2018, 2019    Family History  Problem Relation Age of Onset   Hypertension Mother    Parkinsonism Mother    Arthritis Mother    Cancer Father        prostate   Stroke Sister 20       brain anersym    Early death Sister    Cancer Brother        prostate   Alcohol abuse Brother    Depression Brother    Hyperlipidemia Brother    Hyperlipidemia Brother    Prostate cancer Brother    Cancer Brother    Colon cancer Neg Hx    Esophageal cancer Neg Hx    Rectal cancer Neg Hx    Stomach cancer Neg Hx     Allergies  Allergen Reactions   Latex Itching   Bacitracin Rash   Bacitracin-Polymyxin B Rash    Current Medications:   Current Outpatient Medications:    alendronate  (FOSAMAX ) 70 MG tablet, Take 1 tablet (70 mg total) by mouth every 7 (seven) days. Take with a full glass of water on an empty stomach., Disp: 12 tablet, Rfl: 4   atorvastatin  (LIPITOR) 10 MG tablet, TAKE 1 TABLET BY MOUTH EVERY DAY, Disp: 30 tablet, Rfl: 5   diclofenac Sodium (VOLTAREN) 1 % GEL, Apply topically., Disp: , Rfl:    estradiol  (VIVELLE -DOT) 0.0375 MG/24HR, Place 1 patch onto the skin 2 (two) times a week., Disp: 24  patch, Rfl: 3   fluticasone (FLONASE) 50 MCG/ACT nasal spray, 1 spray in each nostril Nasally Once a day, Disp: , Rfl:    hydrocortisone 2.5 % ointment, 1 application Externally Once a day, Disp: , Rfl:    Omega-3 Fatty Acids (FISH OIL PO), Take by mouth., Disp: , Rfl:    progesterone  (PROMETRIUM ) 100 MG capsule, Take 1 capsule (100 mg total) by mouth daily., Disp: 90 capsule, Rfl: 3   QUERCETIN PO, Take by mouth., Disp: , Rfl:    triamcinolone  cream (KENALOG ) 0.1 %, 1 application topically BID for 14 days, Disp: , Rfl:    Review of Systems:   Negative unless otherwise specified per HPI.  Vitals:   There were no vitals filed for this visit.   There is no height or weight on file to calculate BMI.  Physical Exam:   Physical Exam  Assessment and Plan:   There are no diagnoses linked to this encounter.   I, Bernita Bristle, acting as a Neurosurgeon for Katelyn Velazquez, Katelyn Velazquez., have documented  all relevant documentation on the behalf of Katelyn Velazquez, Katelyn Velazquez, as directed by   while in the presence of Katelyn Velazquez, Katelyn Velazquez.  I, Katelyn Velazquez, Katelyn Velazquez, have reviewed all documentation for this visit. The documentation on 06/23/23 for the exam, diagnosis, procedures, and orders are all accurate and complete.  Katelyn Iba, PA-C

## 2023-06-24 ENCOUNTER — Encounter: Payer: Self-pay | Admitting: Physician Assistant

## 2023-06-24 ENCOUNTER — Ambulatory Visit

## 2023-06-24 ENCOUNTER — Ambulatory Visit (INDEPENDENT_AMBULATORY_CARE_PROVIDER_SITE_OTHER): Admitting: Physician Assistant

## 2023-06-24 VITALS — BP 120/70 | HR 61 | Temp 97.5°F | Ht 61.0 in | Wt 99.0 lb

## 2023-06-24 DIAGNOSIS — M899 Disorder of bone, unspecified: Secondary | ICD-10-CM | POA: Diagnosis not present

## 2023-07-08 ENCOUNTER — Other Ambulatory Visit: Payer: Self-pay | Admitting: Physician Assistant

## 2023-07-08 ENCOUNTER — Ambulatory Visit: Payer: Self-pay | Admitting: Physician Assistant

## 2023-07-08 DIAGNOSIS — M899 Disorder of bone, unspecified: Secondary | ICD-10-CM

## 2023-07-28 ENCOUNTER — Other Ambulatory Visit: Payer: Self-pay | Admitting: Nurse Practitioner

## 2023-07-28 DIAGNOSIS — Z79890 Hormone replacement therapy (postmenopausal): Secondary | ICD-10-CM

## 2023-07-28 NOTE — Telephone Encounter (Signed)
 Med refill request: estradiol  patch Last AEX: 08/25/22 TW Next AEX: 09/16/23 TW Last MMG (if hormonal med) 03/27/23 Refill authorized: Last Rx sent #[id number] with 3 refills on 08/25/22 TW. Please approve or deny

## 2023-07-30 ENCOUNTER — Other Ambulatory Visit: Payer: Self-pay | Admitting: Nurse Practitioner

## 2023-07-30 DIAGNOSIS — Z79890 Hormone replacement therapy (postmenopausal): Secondary | ICD-10-CM

## 2023-07-30 NOTE — Telephone Encounter (Signed)
 Med refill request: progesterone  100 mg Last AEX: 08/25/22 Next AEX: 09/16/23 Last MMG (if hormonal med) 03/27/23 BI-RADS 2 benign Refill authorized: progesterone  100 mg. Please approve or deny as appropriate.

## 2023-08-13 ENCOUNTER — Ambulatory Visit (INDEPENDENT_AMBULATORY_CARE_PROVIDER_SITE_OTHER): Payer: 59 | Admitting: Physician Assistant

## 2023-08-13 ENCOUNTER — Encounter: Payer: Self-pay | Admitting: Physician Assistant

## 2023-08-13 VITALS — BP 110/80 | HR 68 | Temp 97.3°F | Ht 61.0 in | Wt 97.5 lb

## 2023-08-13 DIAGNOSIS — E785 Hyperlipidemia, unspecified: Secondary | ICD-10-CM

## 2023-08-13 DIAGNOSIS — Z0001 Encounter for general adult medical examination with abnormal findings: Secondary | ICD-10-CM

## 2023-08-13 DIAGNOSIS — E88819 Insulin resistance, unspecified: Secondary | ICD-10-CM

## 2023-08-13 DIAGNOSIS — R051 Acute cough: Secondary | ICD-10-CM

## 2023-08-13 DIAGNOSIS — M7989 Other specified soft tissue disorders: Secondary | ICD-10-CM

## 2023-08-13 DIAGNOSIS — M810 Age-related osteoporosis without current pathological fracture: Secondary | ICD-10-CM

## 2023-08-13 DIAGNOSIS — R7989 Other specified abnormal findings of blood chemistry: Secondary | ICD-10-CM | POA: Diagnosis not present

## 2023-08-13 DIAGNOSIS — M899 Disorder of bone, unspecified: Secondary | ICD-10-CM

## 2023-08-13 DIAGNOSIS — Z114 Encounter for screening for human immunodeficiency virus [HIV]: Secondary | ICD-10-CM

## 2023-08-13 LAB — CBC WITH DIFFERENTIAL/PLATELET
Basophils Absolute: 0.1 10*3/uL (ref 0.0–0.1)
Basophils Relative: 0.8 % (ref 0.0–3.0)
Eosinophils Absolute: 0.3 10*3/uL (ref 0.0–0.7)
Eosinophils Relative: 3.9 % (ref 0.0–5.0)
HCT: 37.2 % (ref 36.0–46.0)
Hemoglobin: 12.5 g/dL (ref 12.0–15.0)
Lymphocytes Relative: 16.2 % (ref 12.0–46.0)
Lymphs Abs: 1 10*3/uL (ref 0.7–4.0)
MCHC: 33.6 g/dL (ref 30.0–36.0)
MCV: 97.2 fl (ref 78.0–100.0)
Monocytes Absolute: 1 10*3/uL (ref 0.1–1.0)
Monocytes Relative: 15 % — ABNORMAL HIGH (ref 3.0–12.0)
Neutro Abs: 4.1 10*3/uL (ref 1.4–7.7)
Neutrophils Relative %: 64.1 % (ref 43.0–77.0)
Platelets: 245 10*3/uL (ref 150.0–400.0)
RBC: 3.83 Mil/uL — ABNORMAL LOW (ref 3.87–5.11)
RDW: 12.5 % (ref 11.5–15.5)
WBC: 6.4 10*3/uL (ref 4.0–10.5)

## 2023-08-13 LAB — COMPREHENSIVE METABOLIC PANEL WITH GFR
ALT: 25 U/L (ref 0–35)
AST: 26 U/L (ref 0–37)
Albumin: 4.1 g/dL (ref 3.5–5.2)
Alkaline Phosphatase: 45 U/L (ref 39–117)
BUN: 14 mg/dL (ref 6–23)
CO2: 28 meq/L (ref 19–32)
Calcium: 8.8 mg/dL (ref 8.4–10.5)
Chloride: 99 meq/L (ref 96–112)
Creatinine, Ser: 0.66 mg/dL (ref 0.40–1.20)
GFR: 95.83 mL/min (ref >60.00)
Glucose, Bld: 81 mg/dL (ref 70–99)
Potassium: 4.4 meq/L (ref 3.5–5.1)
Sodium: 133 meq/L — ABNORMAL LOW (ref 135–145)
Total Bilirubin: 0.5 mg/dL (ref 0.2–1.2)
Total Protein: 6.9 g/dL (ref 6.0–8.3)

## 2023-08-13 LAB — LIPID PANEL
Cholesterol: 164 mg/dL (ref 0–200)
HDL: 61.1 mg/dL (ref >39.00)
LDL Cholesterol: 91 mg/dL (ref 0–99)
NonHDL: 103.32
Total CHOL/HDL Ratio: 3
Triglycerides: 60 mg/dL (ref 0.0–149.0)
VLDL: 12 mg/dL (ref 0.0–40.0)

## 2023-08-13 LAB — T4, FREE: Free T4: 0.88 ng/dL (ref 0.60–1.60)

## 2023-08-13 LAB — VITAMIN D 25 HYDROXY (VIT D DEFICIENCY, FRACTURES): VITD: 36.78 ng/mL (ref 30.00–100.00)

## 2023-08-13 LAB — HEMOGLOBIN A1C: Hgb A1c MFr Bld: 5.7 % (ref 4.6–6.5)

## 2023-08-13 LAB — TSH: TSH: 2.51 u[IU]/mL (ref 0.35–5.50)

## 2023-08-13 LAB — T3, FREE: T3, Free: 2.9 pg/mL (ref 2.3–4.2)

## 2023-08-13 MED ORDER — AMOXICILLIN-POT CLAVULANATE 875-125 MG PO TABS
1.0000 | ORAL_TABLET | Freq: Two times a day (BID) | ORAL | 0 refills | Status: DC
Start: 2023-08-13 — End: 2023-09-16

## 2023-08-13 NOTE — Progress Notes (Signed)
 Subjective:    Katelyn Velazquez is a 58 y.o. female and is here for a comprehensive physical exam.  HPI  Health Maintenance Due  Topic Date Due   HIV Screening  Never done   Chief Complaint  Patient presents with   Annual Exam   Cough    Pt c/o cough and nasal congestion since last Thursday.     Acute Concerns: Cough  Pt complains of cough, nasal congestion, runny nose, and sputum production (green) starting Thursday 6/12 Took OTC Severe Cold meds but notes she has run out.  States she did not want to touch water on the first night of symptoms, which she suspects was due to a fever, although she did not confirm a fever with a thermometer.   Lower Leg Swelling Pt reports bilateral lower leg edema.  She has noticed this occurs after eating salty foods.  No current swelling during this visit.  She is concerned this may be an issue later in life.   Chronic Issues: Osteoporosis  Pt is on Fosamax  70 mg once daily.  Tolerating well with no side effects reported.  Pt currently has bone density scheduled at her OBGYN clinic and will call to ensure she is still able to do this at the clinic.   Insulin Resistance Well controlled.  Managed with healthy diet and exercise.  No acute concerns reported today.   HLD Compliant with Atorvastatin  10 mg once daily.  Tolerating well, no side effects.  No concerns today.   Health Maintenance: Immunizations -- UTD Colonoscopy -- UTD, last done 02/12/2015. Polyp found and resected. Repeat 10 yrs; next due 02/11/2025. Mammogram -- UTD, done 03/26/2023. No mammographic evidence of malignancy.  PAP -- UTD last done 07/26/19. Results were normal. Repeat 5 years.  Bone Density -- Done 02/07/2022. Osteoporosis.  Diet -- Overall healthy diet.  Exercise -- Working on regular exercise.   Sleep habits -- No concerns.  Mood -- Stable.  UTD with dentist? - Yes UTD with eye doctor? - Yes  Weight history: Wt Readings from Last 10  Encounters:  08/13/23 97 lb 8 oz (44.2 kg)  06/24/23 99 lb (44.9 kg)  08/25/22 95 lb (43.1 kg)  08/12/22 97 lb 3.2 oz (44.1 kg)  09/23/21 93 lb (42.2 kg)  08/07/21 95 lb (43.1 kg)  07/31/21 97 lb 6.4 oz (44.2 kg)  12/07/20 97 lb 6.4 oz (44.2 kg)  08/06/20 97 lb (44 kg)  06/07/20 99 lb 6.4 oz (45.1 kg)   Body mass index is 18.42 kg/m. Patient's last menstrual period was 08/12/2016 (lmp unknown).  Alcohol use:  reports no history of alcohol use.  Tobacco use:  Tobacco Use: Low Risk  (08/13/2023)   Patient History    Smoking Tobacco Use: Never    Smokeless Tobacco Use: Never    Passive Exposure: Not on file   Eligible for lung cancer screening? no     08/13/2023    8:05 AM  Depression screen PHQ 2/9  Decreased Interest 0  Down, Depressed, Hopeless 0  PHQ - 2 Score 0     Other providers/specialists: Patient Care Team: [name], [name] as PCP - General (Physician Assistant) Andee Bamberger, NP as Nurse Practitioner (Gynecology)    PMHx, SurgHx, SocialHx, Medications, and Allergies were reviewed in the Visit Navigator and updated as appropriate.   Past Medical History:  Diagnosis Date   Allergy    Arthritis    Family history of [place]    Hyperlipidemia  Thyroid  disease      Past Surgical History:  Procedure Laterality Date   BREAST BIOPSY     CESAREAN SECTION  1999   KNEE ARTHROSCOPY  2018, 2019     Family History  Problem Relation Age of Onset   Hypertension Mother    Parkinsonism Mother 35   Arthritis Mother    Cancer Father        prostate   Stroke Sister 54       brain anersym    Early death Sister    Cancer Brother        prostate   Alcohol abuse Brother    Depression Brother    Hyperlipidemia Brother    Hyperlipidemia Brother    Prostate cancer Brother    Cancer Brother    Parkinson's disease Brother 36   Colon cancer Neg Hx    Esophageal cancer Neg Hx    Rectal cancer Neg Hx    Stomach cancer Neg Hx     Social History    Tobacco Use   Smoking status: Never   Smokeless tobacco: Never  Vaping Use   Vaping status: Never Used  Substance Use Topics   Alcohol use: No   Drug use: No    Review of Systems:   Review of Systems  Constitutional:  Negative for chills, fever, malaise/fatigue and weight loss.  HENT:  Negative for hearing loss, sinus pain and sore throat.   Respiratory:  Positive for cough. Negative for hemoptysis.   Cardiovascular:  Positive for leg swelling. Negative for chest pain, palpitations and PND.  Gastrointestinal:  Negative for abdominal pain, constipation, diarrhea, heartburn, nausea and vomiting.  Genitourinary:  Negative for dysuria, frequency and urgency.  Musculoskeletal:  Negative for back pain, myalgias and neck pain.  Skin:  Negative for itching and rash.  Neurological:  Negative for dizziness, tingling, seizures and headaches.  Endo/Heme/Allergies:  Negative for polydipsia.  Psychiatric/Behavioral:  Negative for depression. The patient is not nervous/anxious.       Objective:   BP 110/80 (BP Location: Left Arm, Patient Position: Sitting, Cuff Size: Normal)   Pulse 68   Temp (!) 97.3 F (36.3 C) (Temporal)   Ht 5' 1 (1.549 m)   Wt 97 lb 8 oz (44.2 kg)   LMP 08/12/2016 (LMP Unknown)   SpO2 99%   BMI 18.42 kg/m  Body mass index is 18.42 kg/m.   General Appearance:    Alert, cooperative, no distress, appears stated age  Head:    Normocephalic, without obvious abnormality, atraumatic  Eyes:    PERRL, conjunctiva/corneas clear, EOM's intact, fundi    benign, both eyes  Ears:    Normal TM's and external ear canals, both ears  Nose:   Nares normal, septum midline, mucosa normal, no drainage    or sinus tenderness  Throat:   Lips, mucosa, and tongue normal; teeth and gums normal  Neck:   Supple, symmetrical, trachea midline, no adenopathy;    thyroid :  no enlargement/tenderness/nodules; no carotid   bruit or JVD  Back:     Symmetric, no curvature, ROM normal, no  CVA tenderness  Lungs:     Clear to auscultation bilaterally, respirations unlabored  Chest Wall:    No tenderness or deformity   Heart:    Regular rate and rhythm, S1 and S2 normal, no murmur, rub or gallop  Breast Exam:    Deferred  Abdomen:     Soft, non-tender, bowel sounds active all four quadrants,  no masses, no organomegaly  Genitalia:    Deferred  Extremities:   Extremities normal, atraumatic, no cyanosis or edema  Pulses:   2+ and symmetric all extremities  Skin:   Skin color, texture, turgor normal, no rashes or lesions  Lymph nodes:   Cervical, supraclavicular, and axillary nodes normal  Neurologic:   CNII-XII intact, normal strength, sensation and reflexes    throughout    Assessment/Plan:   Encounter for general adult medical examination with abnormal findings Today patient counseled on age appropriate routine health concerns for screening and prevention, each reviewed and up to date or declined. Immunizations reviewed and up to date or declined. Labs ordered and reviewed. Risk factors for depression reviewed and negative. Hearing function and visual acuity are intact. ADLs screened and addressed as needed. Functional ability and level of safety reviewed and appropriate. Education, counseling and referrals performed based on assessed risks today. Patient provided with a copy of personalized plan for preventive services.  Acute cough No red flags on exam.   Will initiate augmentin per orders.  Discussed taking medications as prescribed.  Reviewed return precautions including new or worsening fever, SOB, new or worsening cough or other concerns.  Push fluids and rest.  I recommend that patient follow-up if symptoms worsen or persist despite treatment x 7-10 days, sooner if needed.  Osteoporosis without current pathological fracture, unspecified osteoporosis type DEXA ordered Continue fosamax  Update vitamin D , calcium , CMP   Insulin resistance Update Hemoglobin A1c and  provide recommendations   Hyperlipidemia, unspecified hyperlipidemia type Update lipid panel and provide recommendations   Swelling of lower extremity No red flags No swelling today Encouraged use of compression socks Question if possible medication side effect(s) Recommend close follow up if new/worsening symptom(s)   Pubic bone pain Encouraged her to get MRI  Low TSH level Update thyroid  panel and provide recommendations   Screening for HIV (human immunodeficiency virus) Update HIV screen   I, Bernita Bristle, acting as a Neurosurgeon for [name], [name]., have documented all relevant documentation on the behalf of [name], [name], as directed by   while in the presence of [name], [name].  I, [name], [name], have reviewed all documentation for this visit. The documentation on 08/13/23 for the exam, diagnosis, procedures, and orders are all accurate and complete.  [name], PA-C [facility]

## 2023-08-13 NOTE — Patient Instructions (Addendum)
 It was great to see you!  Please call [facility] re: DEXA and [facility] re: MRI  Please go to the lab for blood work.   Our office will call you with your results unless you have chosen to receive results via MyChart.  If your blood work is normal we will follow-up each year for physicals and as scheduled for chronic medical problems.  If anything is abnormal we will treat accordingly and get you in for a follow-up.  Take care,  Lura Falor

## 2023-08-17 ENCOUNTER — Ambulatory Visit: Payer: Self-pay | Admitting: Physician Assistant

## 2023-08-17 DIAGNOSIS — E871 Hypo-osmolality and hyponatremia: Secondary | ICD-10-CM

## 2023-08-18 LAB — HIV ANTIBODY (ROUTINE TESTING W REFLEX): HIV 1&2 Ab, 4th Generation: NONREACTIVE

## 2023-08-18 LAB — LIPOPROTEIN A (LPA): Lipoprotein (a): 82 nmol/L — ABNORMAL HIGH (ref <75)

## 2023-08-23 ENCOUNTER — Other Ambulatory Visit: Payer: Self-pay | Admitting: Nurse Practitioner

## 2023-08-23 DIAGNOSIS — Z79890 Hormone replacement therapy (postmenopausal): Secondary | ICD-10-CM

## 2023-08-24 ENCOUNTER — Encounter: Payer: Self-pay | Admitting: Family Medicine

## 2023-08-25 NOTE — Telephone Encounter (Signed)
 Med refill request:progesterone  100 mg PO daily Last AEX: 08/25/22 -TW Next AEX: 09/16/23 -TW Last MMG (if hormonal med) 03/27/23; birads 2 benign   Last RX sent 07/30/23, #60/0RF  Pharmacy requesting 90 day supply  Refill authorized: Please Advise?

## 2023-08-30 ENCOUNTER — Other Ambulatory Visit: Payer: Self-pay | Admitting: Physician Assistant

## 2023-08-30 DIAGNOSIS — E785 Hyperlipidemia, unspecified: Secondary | ICD-10-CM

## 2023-09-01 ENCOUNTER — Encounter: Payer: Self-pay | Admitting: Physician Assistant

## 2023-09-14 ENCOUNTER — Ambulatory Visit
Admission: RE | Admit: 2023-09-14 | Discharge: 2023-09-14 | Disposition: A | Discharge status: Home / Self Care | Source: Ambulatory Visit | Attending: Physician Assistant | Admitting: Physician Assistant

## 2023-09-14 ENCOUNTER — Other Ambulatory Visit (INDEPENDENT_AMBULATORY_CARE_PROVIDER_SITE_OTHER)

## 2023-09-14 ENCOUNTER — Ambulatory Visit: Payer: Self-pay | Admitting: Physician Assistant

## 2023-09-14 DIAGNOSIS — M899 Disorder of bone, unspecified: Secondary | ICD-10-CM

## 2023-09-14 DIAGNOSIS — E871 Hypo-osmolality and hyponatremia: Secondary | ICD-10-CM

## 2023-09-14 LAB — BASIC METABOLIC PANEL WITH GFR
BUN: 16 mg/dL (ref 6–23)
CO2: 28 meq/L (ref 19–32)
Calcium: 8.9 mg/dL (ref 8.4–10.5)
Chloride: 101 meq/L (ref 96–112)
Creatinine, Ser: 0.83 mg/dL (ref 0.40–1.20)
GFR: 76.96 mL/min (ref >60.00)
Glucose, Bld: 74 mg/dL (ref 70–99)
Potassium: 4.4 meq/L (ref 3.5–5.1)
Sodium: 135 meq/L (ref 135–145)

## 2023-09-16 ENCOUNTER — Ambulatory Visit (INDEPENDENT_AMBULATORY_CARE_PROVIDER_SITE_OTHER): Admitting: Nurse Practitioner

## 2023-09-16 ENCOUNTER — Encounter: Payer: Self-pay | Admitting: Nurse Practitioner

## 2023-09-16 VITALS — BP 112/70 | HR 67 | Ht 60.0 in | Wt 97.0 lb

## 2023-09-16 DIAGNOSIS — Z1331 Encounter for screening for depression: Secondary | ICD-10-CM

## 2023-09-16 DIAGNOSIS — Z01419 Encounter for gynecological examination (general) (routine) without abnormal findings: Secondary | ICD-10-CM

## 2023-09-16 DIAGNOSIS — M810 Age-related osteoporosis without current pathological fracture: Secondary | ICD-10-CM | POA: Diagnosis not present

## 2023-09-16 DIAGNOSIS — Z79890 Hormone replacement therapy (postmenopausal): Secondary | ICD-10-CM | POA: Diagnosis not present

## 2023-09-16 DIAGNOSIS — N952 Postmenopausal atrophic vaginitis: Secondary | ICD-10-CM

## 2023-09-16 MED ORDER — ESTRADIOL 0.0375 MG/24HR TD PTTW: 1.0000 | MEDICATED_PATCH | TRANSDERMAL | 3 refills | Status: AC

## 2023-09-16 MED ORDER — PROGESTERONE MICRONIZED 100 MG PO CAPS: 100.0000 mg | ORAL_CAPSULE | Freq: Every day | ORAL | 3 refills | Status: AC

## 2023-09-16 NOTE — Progress Notes (Signed)
 Kalleigh Tu-Chalmers 1965/12/05 [id number]   History:  58 y.o. G1P1 presents for annual exam. Postmenopausal - on HRT, no bleeding. Complains of painful intercourse. No longer does penetration due to discomfort. Normal pap and mammogram history. Osteoporosis of spine managed by PCP, on Fosamax  since June 2021.   Gynecologic History No LMP recorded. Patient is postmenopausal.   Contraception/Family planning: post menopausal status and vasectomy Sexually active: Yes  Health Maintenance Last Pap: 07/26/2019. Results were: Normal neg HPV Last mammogram: 03/27/2023. Results were: Normal Last colonoscopy: 02/12/2015. Results were: Benign polyp, 10-year recall Last Dexa: 02/07/2022. Results were: T-score -2.4 of spine (2.5 in 2022)     09/16/2023   11:49 AM  Depression screen PHQ 2/9  Decreased Interest 0  Down, Depressed, Hopeless 0  PHQ - 2 Score 0     Past medical history, past surgical history, family history and social history were all reviewed and documented in the EPIC chart. 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   ROS:  A ROS was performed and pertinent positives and negatives are included.  Exam:  Vitals:   09/16/23 1151  BP: 112/70  Pulse: 67  SpO2: 99%  Weight: 97 lb (44 kg)  Height: 5' (1.524 m)      Body mass index is 18.94 kg/m.  General appearance:  Normal Thyroid :  Symmetrical, normal in size, without palpable masses or nodularity. Respiratory  Auscultation:  Clear without wheezing or rhonchi Cardiovascular  Auscultation:  Regular rate, without rubs, murmurs or gallops  Edema/varicosities:  Not grossly evident Abdominal  Soft,nontender, without masses, guarding or rebound.  Liver/spleen:  No organomegaly noted  Hernia:  None appreciated  Skin  Inspection:  Grossly normal Breasts: Examined lying and sitting.   Right: Without masses, retractions, nipple discharge or axillary adenopathy.   Left: Without  masses, retractions, nipple discharge or axillary adenopathy. Pelvic: External genitalia:  no lesions              Urethra:  normal appearing urethra with no masses, tenderness or lesions              Bartholins and Skenes: normal                 Vagina: normal appearing vagina with normal color and discharge, no lesions              Cervix: no lesions Bimanual Exam:  Uterus:  no masses or tenderness              Adnexa: no mass, fullness, tenderness              Rectovaginal: Deferred              Anus:  normal, no lesions  Patient informed chaperone available to be present for breast and pelvic exam. Patient has requested no chaperone to be present. Patient has been advised what will be completed during breast and pelvic exam.   Assessment/Plan:  58 y.o. G1P1 for annual exam.   Well female exam with routine gynecological exam - Education provided on SBEs, importance of preventative screenings, current guidelines, high calcium  diet, regular exercise, and multivitamin daily. Labs with PCP.   Postmenopausal hormone therapy - Plan: estradiol  (VIVELLE -DOT) 0.0375 MG/24HR twice weekly, progesterone  (PROMETRIUM ) 100 MG capsule nightly. Aware of risk and benefits of use.   Age-related osteoporosis without current pathological fracture - Managed by PCP, on Fosamax  since 07/2019. DXA 01/2022 - T-score -2.4 in spine (2.5  in 2022). Will also benefit from ERT. DXA scheduled in December.   Vaginal atrophy - Pain with intercourse. Discussed option for vaginal estrogen. Wants to talk to husband. Oil or silicone lubricant recommended.   Screening for cervical cancer - Normal Pap history.  Will repeat at 5-year interval per guidelines.  Screening for breast cancer - Normal mammogram history.  Continue annual screenings.  Normal breast exam today.  Screening for colon cancer - 2016 colonoscopy. Will repeat at GI's recommended interval.   Return in about 1 year (around 09/15/2024) for  Annual.     Annabella DELENA Shutter DNP, 12:10 PM 09/16/2023

## 2023-09-16 NOTE — Patient Instructions (Signed)

## 2023-09-22 ENCOUNTER — Ambulatory Visit: Payer: Self-pay | Admitting: Physician Assistant

## 2023-09-24 ENCOUNTER — Encounter: Payer: Self-pay | Admitting: Nurse Practitioner

## 2023-10-04 ENCOUNTER — Other Ambulatory Visit: Payer: Self-pay | Admitting: Physician Assistant

## 2023-10-04 DIAGNOSIS — M810 Age-related osteoporosis without current pathological fracture: Secondary | ICD-10-CM

## 2023-11-11 ENCOUNTER — Encounter: Payer: Self-pay | Admitting: Physician Assistant

## 2023-11-12 MED ORDER — COVID-19 MRNA VAC-TRIS(PFIZER) 30 MCG/0.3ML IM SUSY: 0.3000 mL | PREFILLED_SYRINGE | Freq: Once | INTRAMUSCULAR | 0 refills | Status: AC

## 2023-11-12 MED ORDER — COVID-19 MRNA VAC-TRIS(PFIZER) 30 MCG/0.3ML IM SUSY: 0.3000 mL | PREFILLED_SYRINGE | Freq: Once | INTRAMUSCULAR | 0 refills | Status: DC

## 2023-12-13 ENCOUNTER — Other Ambulatory Visit: Payer: Self-pay | Admitting: Nurse Practitioner

## 2023-12-13 DIAGNOSIS — Z79890 Hormone replacement therapy (postmenopausal): Secondary | ICD-10-CM

## 2023-12-14 ENCOUNTER — Telehealth: Payer: Self-pay

## 2023-12-14 NOTE — Telephone Encounter (Signed)
 Med refill request: estradiol  (VIVELLE -DOT) 0.0375 MG/24HR  Start:  09/17/23 Disp:   24 patches Refills:  3  Last AEX:  09/16/23 Next AEX:  Not yet scheduled Last MMG (if hormonal med):  01/18/14 Refill authorized? Please Advise.

## 2023-12-15 ENCOUNTER — Encounter: Payer: Self-pay | Admitting: Nurse Practitioner

## 2023-12-25 ENCOUNTER — Other Ambulatory Visit

## 2024-02-08 ENCOUNTER — Ambulatory Visit
Admission: RE | Admit: 2024-02-08 | Discharge: 2024-02-08 | Disposition: A | Source: Ambulatory Visit | Attending: Physician Assistant

## 2024-02-08 DIAGNOSIS — M8580 Other specified disorders of bone density and structure, unspecified site: Secondary | ICD-10-CM

## 2024-02-10 ENCOUNTER — Ambulatory Visit: Payer: Self-pay | Admitting: Physician Assistant

## 2024-02-11 ENCOUNTER — Other Ambulatory Visit: Payer: Self-pay | Admitting: Physician Assistant

## 2024-02-11 DIAGNOSIS — M150 Primary generalized (osteo)arthritis: Secondary | ICD-10-CM

## 2024-02-11 MED ORDER — DENOSUMAB 60 MG/ML ~~LOC~~ SOSY: 60.0000 mg | PREFILLED_SYRINGE | SUBCUTANEOUS | 0 refills | Status: DC

## 2024-02-11 NOTE — Telephone Encounter (Signed)
 Pt agreeable to starting Prolia  injections, ok to place order and start PA process for patient

## 2024-02-11 NOTE — Addendum Note (Signed)
 Addended by: KEARNEY KOLEEN RAMAN on: 02/11/2024 11:38 AM   Modules accepted: Orders

## 2024-02-12 ENCOUNTER — Other Ambulatory Visit: Payer: Self-pay | Admitting: Medical Genetics

## 2024-02-15 ENCOUNTER — Other Ambulatory Visit (HOSPITAL_COMMUNITY): Payer: Self-pay

## 2024-02-15 ENCOUNTER — Other Ambulatory Visit: Payer: Self-pay

## 2024-02-15 ENCOUNTER — Telehealth: Payer: Self-pay

## 2024-02-15 DIAGNOSIS — M150 Primary generalized (osteo)arthritis: Secondary | ICD-10-CM

## 2024-02-15 MED ORDER — DENOSUMAB 60 MG/ML ~~LOC~~ SOSY
60.0000 mg | PREFILLED_SYRINGE | Freq: Once | SUBCUTANEOUS | Status: AC
  Administered 2024-03-09: 60 mg via SUBCUTANEOUS

## 2024-02-15 MED ORDER — PROLIA 60 MG/ML ~~LOC~~ SOSY: 60.0000 mg | PREFILLED_SYRINGE | SUBCUTANEOUS | 0 refills | Status: DC

## 2024-02-15 NOTE — Telephone Encounter (Signed)
 Prolia  VOB initiated via [email or web address]  Next Prolia  inj DUE: NEW START

## 2024-02-15 NOTE — Telephone Encounter (Signed)
 MEDICAL PA SUBMITTED VIA [employer]AILITY.     PHARMACY BENEFIT: PLAN EXCLUSION

## 2024-02-15 NOTE — Telephone Encounter (Signed)
 Katelyn Velazquez

## 2024-02-17 ENCOUNTER — Telehealth: Payer: Self-pay

## 2024-02-17 NOTE — Telephone Encounter (Signed)
 Copied from CRM #[id number]. Topic: Clinical - Medication Question >> Feb 16, 2024  2:53 PM Robinson H wrote: Reason for CRM: [place] [facility] calling to see if medical benefits were used for denosumab  (PROLIA ) injection 60 mg and see if he can obtain that member ID and number called to verify information.  [place] [facility] [phone]  Please see recent call and advise pharmacy

## 2024-02-22 NOTE — Telephone Encounter (Signed)
 Pt ready for scheduling for PROLIA  on or after : 02/22/24  Option# 1: Buy/Bill (Office supplied medication)  Out-of-pocket cost due at time of clinic visit: $0  Number of injection/visits approved: 2  Primary: [employer] Prolia  co-insurance: 0% Admin fee co-insurance: 0%  Secondary: --- Prolia  co-insurance:  Admin fee co-insurance:   Medical Benefit Details: Date Benefits were checked: 02/15/24 Deductible: $3300 Met of $3300 Required/ Coinsurance: 0%/ Admin Fee: 0%  Prior Auth: APPROVED PA# [id number] Expiration Date: 02/15/24-02/13/25  # of doses approved: 2 ----------------------------------------------------------------------- Option# 2- Med Obtained from pharmacy:  Pharmacy benefit: Copay $--- (Paid to pharmacy) Admin Fee: --- (Pay at clinic)  Prior Auth: PLAN EXCLUSION PA# Expiration Date:   # of doses approved:   If patient wants fill through the pharmacy benefit please send prescription to: ---, and include estimated need by date in rx notes. Pharmacy will ship medication directly to the office.  Patient IS eligible for Prolia  Copay Card. Copay Card can make patient's cost as little as $25. Link to apply: https://www.amgensupportplus.com/copay  ** This summary of benefits is an estimation of the patient's out-of-pocket cost. Exact cost may very based on individual plan coverage.

## 2024-02-22 NOTE — Telephone Encounter (Signed)
 APPROVED

## 2024-02-23 NOTE — Telephone Encounter (Signed)
 Hasna, okay for pt to schedule Prolia  injection?

## 2024-02-23 NOTE — Telephone Encounter (Signed)
 [facility]

## 2024-02-24 NOTE — Telephone Encounter (Signed)
 Spoke to pt told her she does not have to do anything,but come her for her injection we will use our stock and [facility] will [facility] and [facility]. Pt verbalized understanding.

## 2024-02-24 NOTE — Telephone Encounter (Signed)
 Pt is scheduled for Prolia  on 03/09/24. Pt states [facility]  [facility]. Asked if we could look into so she knows what to do. Please advise.

## 2024-02-24 NOTE — Telephone Encounter (Signed)
 Called [facility] and spoke to [place], told her need to cancel Rx for Prolia  that was sent in. Natalia verbalized understanding and will cancel Rx.

## 2024-03-01 ENCOUNTER — Encounter: Payer: Self-pay | Admitting: Physician Assistant

## 2024-03-02 ENCOUNTER — Ambulatory Visit: Payer: Self-pay

## 2024-03-02 NOTE — Telephone Encounter (Signed)
 FYI Only or Action Required?: FYI only for provider: home care advised.  Patient was last seen in primary care on 08/13/2023 by Job Lukes, PA.  Called Nurse Triage reporting URI.  Symptoms began before Christmas.  Interventions attempted: OTC medications: cold and cough medicine.  Symptoms are: gradually improving.  Triage Disposition: Home Care  Patient/caregiver understands and will follow disposition?: Yes  Copied from CRM #[id number]. Topic: Clinical - Red Word Triage >> Mar 02, 2024  8:19 AM Larissa RAMAN wrote: [facility] that prompted transfer to Nurse Triage: cough w/ mucous x 2 weeks- worsening Reason for Disposition  Common cold with no complications  Answer Assessment - Initial Assessment Questions 1. ONSET: When did the nasal discharge start?      Since before Christmas  2. AMOUNT: How much discharge is there?      A lot, my lips and nose get chapped up.   3. COUGH: Do you have a cough? If Yes, ask: Describe the color of your mucus. (e.g., clear, white, yellow, green)     Yes, I haven't been looking at it when describing phlegm  4. RESPIRATORY DISTRESS: Describe your breathing.      It's fine, pt noted to be speaking in full sentences at a time.   5. FEVER: Do you have a fever? If Yes, ask: What is your temperature, how was it measured, and when did it start?     Denies  6. SEVERITY: Overall, how bad are you feeling right now? (e.g., doesn't interfere with normal activities, staying home from school/work, staying in bed)      My energy level is fine, back to normal.   7. OTHER SYMPTOMS: Do you have any other symptoms? (e.g., earache, mouth sores, sore throat, wheezing)     Denies  Protocols used: Common Cold-A-AH

## 2024-03-02 NOTE — Telephone Encounter (Signed)
 Noted

## 2024-03-03 ENCOUNTER — Telehealth: Payer: Self-pay

## 2024-03-03 ENCOUNTER — Other Ambulatory Visit (HOSPITAL_COMMUNITY): Payer: Self-pay

## 2024-03-03 DIAGNOSIS — M810 Age-related osteoporosis without current pathological fracture: Secondary | ICD-10-CM

## 2024-03-03 NOTE — Telephone Encounter (Signed)
 Prolia VOB initiated via [email or web address]  Next Prolia inj DUE: NOW

## 2024-03-05 ENCOUNTER — Other Ambulatory Visit: Payer: Self-pay | Admitting: Physician Assistant

## 2024-03-05 DIAGNOSIS — E785 Hyperlipidemia, unspecified: Secondary | ICD-10-CM

## 2024-03-07 ENCOUNTER — Encounter: Payer: Self-pay | Admitting: Physician Assistant

## 2024-03-07 ENCOUNTER — Other Ambulatory Visit (HOSPITAL_COMMUNITY): Payer: Self-pay

## 2024-03-07 NOTE — Telephone Encounter (Signed)
 MEDICAL PA SUBMITTED VIA [email or web address] EOC: [id number]

## 2024-03-07 NOTE — Telephone Encounter (Addendum)
 Katelyn Velazquez

## 2024-03-07 NOTE — Telephone Encounter (Signed)
 Katelyn Velazquez, see pt's message about vaccine. We do not carry that vaccine.

## 2024-03-09 ENCOUNTER — Ambulatory Visit (INDEPENDENT_AMBULATORY_CARE_PROVIDER_SITE_OTHER)

## 2024-03-09 DIAGNOSIS — M150 Primary generalized (osteo)arthritis: Secondary | ICD-10-CM

## 2024-03-09 MED ORDER — DENOSUMAB 60 MG/ML ~~LOC~~ SOSY: 60.0000 mg | PREFILLED_SYRINGE | SUBCUTANEOUS | Status: AC

## 2024-03-09 NOTE — Progress Notes (Signed)
 Patient is in office today for a nurse visit for Prolia  60mg . Patient Injection was given in the  Right lower quad. abdomne. Patient tolerated injection well. No other questions or concerns were addressed during the visit.

## 2024-03-11 ENCOUNTER — Other Ambulatory Visit: Payer: Self-pay | Admitting: Physician Assistant

## 2024-03-11 MED ORDER — AZITHROMYCIN 500 MG PO TABS: ORAL_TABLET | ORAL | 0 refills | Status: AC

## 2024-03-21 ENCOUNTER — Other Ambulatory Visit (HOSPITAL_COMMUNITY): Payer: Self-pay

## 2024-03-21 NOTE — Telephone Encounter (Signed)
 MEDICAL PA DENIED    DENIAL LETTER IN MEDIA

## 2024-03-21 NOTE — Telephone Encounter (Signed)
 PHARMACY PA SUBMITTED VIA LATENT. KEY: [id number]

## 2024-03-22 NOTE — Telephone Encounter (Signed)
 Resubmitted medical PA. Per denial letter, patient should meet requirements.  Prior Auth (EOC) ID: [id number]

## 2024-03-22 NOTE — Telephone Encounter (Signed)
 PHARMACY PA DENIED    DENIAL LETTER IN MEDIA

## 2024-03-23 ENCOUNTER — Other Ambulatory Visit: Payer: Self-pay | Admitting: Medical Genetics

## 2024-03-23 DIAGNOSIS — Z006 Encounter for examination for normal comparison and control in clinical research program: Secondary | ICD-10-CM

## 2024-03-25 NOTE — Telephone Encounter (Signed)
 Please see message PA for Prolia  denies by both insurances.

## 2024-03-25 NOTE — Telephone Encounter (Signed)
 Discussed with Lucie the denial because pt has not tried Boniva or Reclast infusions. Lucie said to send her to Osteoporosis clinic.  Referral placed in Epic.

## 2024-03-25 NOTE — Telephone Encounter (Signed)
 Katelyn Velazquez has decided to send pt to the Osteoporosis Clinic to see if they can treat her. No need to do appeal. Thanks

## 2024-03-25 NOTE — Telephone Encounter (Signed)
 Noted.

## 2024-03-25 NOTE — Telephone Encounter (Signed)
 Left message on voicemail to call office. Please tell pt prior authorization for Prolia  was denied by her insurance. We have placed a referral for her to see the Osteoporosis Clinic to see if they can help.

## 2024-03-25 NOTE — Telephone Encounter (Signed)
 Information has been sent to clinical pharmacist for appeals review. It may take 5-7 days to prepare the necessary documentation to request the appeal from the insurance.

## 2024-03-25 NOTE — Telephone Encounter (Signed)
 Medical PA denied again. Denial letter in media

## 2024-03-29 NOTE — Telephone Encounter (Signed)
Left message on voicemail to call office. Also sending My Chart message.

## 2024-03-30 ENCOUNTER — Other Ambulatory Visit: Payer: Self-pay | Admitting: *Deleted

## 2024-03-30 ENCOUNTER — Encounter: Payer: Self-pay | Admitting: *Deleted

## 2024-03-30 DIAGNOSIS — M810 Age-related osteoporosis without current pathological fracture: Secondary | ICD-10-CM

## 2024-04-19 ENCOUNTER — Other Ambulatory Visit

## 2024-09-06 ENCOUNTER — Ambulatory Visit
